# Patient Record
Sex: Female | Born: 1940 | Race: White | Hispanic: No | Marital: Married | Smoking: Former smoker
Health system: Southern US, Community
[De-identification: ages and names within clinical notes are randomized; demographics above are authoritative.]

## PROBLEM LIST (undated history)

## (undated) DIAGNOSIS — R479 Unspecified speech disturbances: Secondary | ICD-10-CM

## (undated) DIAGNOSIS — E7800 Pure hypercholesterolemia, unspecified: Secondary | ICD-10-CM

## (undated) DIAGNOSIS — E119 Type 2 diabetes mellitus without complications: Secondary | ICD-10-CM

## (undated) DIAGNOSIS — J449 Chronic obstructive pulmonary disease, unspecified: Secondary | ICD-10-CM

## (undated) DIAGNOSIS — R413 Other amnesia: Secondary | ICD-10-CM

## (undated) DIAGNOSIS — E079 Disorder of thyroid, unspecified: Secondary | ICD-10-CM

## (undated) DIAGNOSIS — I10 Essential (primary) hypertension: Secondary | ICD-10-CM

## (undated) HISTORY — DX: Essential (primary) hypertension: I10

## (undated) HISTORY — DX: Chronic obstructive pulmonary disease, unspecified: J44.9

## (undated) HISTORY — DX: Pure hypercholesterolemia, unspecified: E78.00

## (undated) HISTORY — PX: OTHER SURGICAL HISTORY: SHX169

## (undated) HISTORY — DX: Type 2 diabetes mellitus without complications: E11.9

## (undated) HISTORY — PX: RHINOPLASTY: SUR1284

## (undated) HISTORY — DX: Other amnesia: R41.3

## (undated) HISTORY — DX: Unspecified speech disturbances: R47.9

## (undated) HISTORY — PX: TONSILLECTOMY: SUR1361

---
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
## 2005-11-26 ENCOUNTER — Ambulatory Visit: Payer: Self-pay | Admitting: Orthopedic Surgery

## 2005-12-16 ENCOUNTER — Ambulatory Visit: Payer: Self-pay | Admitting: Orthopedic Surgery

## 2006-01-25 ENCOUNTER — Ambulatory Visit: Payer: Self-pay | Admitting: Orthopedic Surgery

## 2006-02-18 ENCOUNTER — Ambulatory Visit: Payer: Self-pay | Admitting: Orthopedic Surgery

## 2006-08-17 HISTORY — PX: CERVICAL FUSION: SHX112

## 2007-07-06 ENCOUNTER — Encounter: Payer: Self-pay | Admitting: Orthopedic Surgery

## 2007-08-24 ENCOUNTER — Encounter: Payer: Self-pay | Admitting: Orthopedic Surgery

## 2007-09-26 ENCOUNTER — Encounter: Payer: Self-pay | Admitting: Orthopedic Surgery

## 2007-11-16 ENCOUNTER — Encounter: Payer: Self-pay | Admitting: Orthopedic Surgery

## 2008-04-16 ENCOUNTER — Encounter: Payer: Self-pay | Admitting: Orthopedic Surgery

## 2008-08-02 ENCOUNTER — Encounter: Payer: Self-pay | Admitting: Orthopedic Surgery

## 2008-10-30 ENCOUNTER — Ambulatory Visit: Payer: Self-pay | Admitting: Cardiology

## 2008-11-15 ENCOUNTER — Encounter: Payer: Self-pay | Admitting: Orthopedic Surgery

## 2009-01-02 ENCOUNTER — Encounter: Admission: RE | Admit: 2009-01-02 | Discharge: 2009-01-02 | Payer: Self-pay | Admitting: Otolaryngology

## 2009-02-14 ENCOUNTER — Encounter: Payer: Self-pay | Admitting: Orthopedic Surgery

## 2009-07-03 ENCOUNTER — Encounter: Admission: RE | Admit: 2009-07-03 | Discharge: 2009-07-03 | Payer: Self-pay | Admitting: Otolaryngology

## 2009-11-14 ENCOUNTER — Encounter: Payer: Self-pay | Admitting: Orthopedic Surgery

## 2010-09-16 NOTE — Letter (Signed)
Summary: Office note [facility]ters  Office note [facility] Ch[facility]   Imported By: Cammie Sickle 12/02/2009 06:27:58  _____________________________________________________________________  External Attachment:    Type:   Image     Comment:   External Document

## 2011-06-08 HISTORY — PX: OTHER SURGICAL HISTORY: SHX169

## 2011-11-16 DIAGNOSIS — G459 Transient cerebral ischemic attack, unspecified: Secondary | ICD-10-CM

## 2012-01-04 ENCOUNTER — Other Ambulatory Visit: Payer: Self-pay | Admitting: Neurology

## 2012-01-04 DIAGNOSIS — G459 Transient cerebral ischemic attack, unspecified: Secondary | ICD-10-CM

## 2012-01-13 ENCOUNTER — Ambulatory Visit
Admission: RE | Admit: 2012-01-13 | Discharge: 2012-01-13 | Disposition: A | Discharge status: Home / Self Care | Payer: Medicare Other | Source: Ambulatory Visit | Attending: Neurology | Admitting: Neurology

## 2012-01-13 DIAGNOSIS — G459 Transient cerebral ischemic attack, unspecified: Secondary | ICD-10-CM

## 2012-01-13 MED ORDER — GADOBENATE DIMEGLUMINE 529 MG/ML IV SOLN
15.0000 mL | Freq: Once | INTRAVENOUS | Status: AC | PRN
  Administered 2012-01-13: 15 mL via INTRAVENOUS

## 2013-02-14 ENCOUNTER — Encounter: Payer: Self-pay | Admitting: Neurology

## 2013-02-14 DIAGNOSIS — I6320 Cerebral infarction due to unspecified occlusion or stenosis of unspecified precerebral arteries: Secondary | ICD-10-CM

## 2013-02-14 DIAGNOSIS — J3800 Paralysis of vocal cords and larynx, unspecified: Secondary | ICD-10-CM

## 2013-02-14 DIAGNOSIS — G459 Transient cerebral ischemic attack, unspecified: Secondary | ICD-10-CM

## 2013-02-14 DIAGNOSIS — M75101 Unspecified rotator cuff tear or rupture of right shoulder, not specified as traumatic: Secondary | ICD-10-CM

## 2013-02-14 DIAGNOSIS — R471 Dysarthria and anarthria: Secondary | ICD-10-CM | POA: Insufficient documentation

## 2013-02-14 DIAGNOSIS — G629 Polyneuropathy, unspecified: Secondary | ICD-10-CM

## 2013-02-14 DIAGNOSIS — R7303 Prediabetes: Secondary | ICD-10-CM

## 2013-02-14 DIAGNOSIS — G250 Essential tremor: Secondary | ICD-10-CM

## 2013-02-14 DIAGNOSIS — R413 Other amnesia: Secondary | ICD-10-CM

## 2013-02-27 ENCOUNTER — Encounter: Payer: Self-pay | Admitting: Neurology

## 2013-02-27 ENCOUNTER — Ambulatory Visit (INDEPENDENT_AMBULATORY_CARE_PROVIDER_SITE_OTHER): Payer: Medicare Other | Admitting: Neurology

## 2013-02-27 VITALS — BP 109/63 | HR 71 | Ht 63.0 in | Wt 158.0 lb

## 2013-02-27 DIAGNOSIS — I6789 Other cerebrovascular disease: Secondary | ICD-10-CM

## 2013-02-27 DIAGNOSIS — R0989 Other specified symptoms and signs involving the circulatory and respiratory systems: Secondary | ICD-10-CM

## 2013-02-27 DIAGNOSIS — R0683 Snoring: Secondary | ICD-10-CM

## 2013-02-27 DIAGNOSIS — G4730 Sleep apnea, unspecified: Secondary | ICD-10-CM

## 2013-02-27 DIAGNOSIS — R413 Other amnesia: Secondary | ICD-10-CM

## 2013-02-27 NOTE — Progress Notes (Signed)
[facility] [address] [place]. [place] [address] [phone]       OFFICE FOLLOW-UP NOTE  [name] Date of Birth:  1940-11-05 Medical Record Number:  [id number]   HPI:71 year with intermittent transient confusion, slurred speech and progressive memory loss possibly from small vessel disease on MRI. 02/27/2013  She is seen today for the first time after last visit with Dr. love on 08/24/12 and since his retirement she is here to establish neurology follow up with me. She has her history of intermittent confusion and memory loss as well as some slurred speech starting in 2009. Nose ears is seems to have slowly gotten worse. Initial MRI scan of the brain on 09/10/2008 showed changes of chronic microvascular ischemia involving the left pons as well as bilateral subcortical white matter. Vascular evaluation and that time including carotid Dopplers was unremarkable. Dr. love did evaluation for neuromuscular disease including CPK, acetylcholine receptor antibodies and EMG studies which were all normal. She has continued to have symptoms of mild cognitive impairment since then and has had 6 subjective worsening given the objective testing in the office had shown Mini-Mental scores ranging from 27-30. She has been on Aricept 10 mg daily for several years. Last MR scan of the brain had shown stable white matter changes on 5/29 /2013. 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 She says she continues to have some intermittent speech difficulties particularly when she is tired and her confusion, memory loss and speech difficulties all seem to be worse. On inquiry she admits to excessive daytime sleepiness and not enough sleep at night as well as snoring and husband admits to witnessed episodes of sleep apnea. She has never been evaluated with a sleep study. ROS:   14 system review of systems is positive for fatigue, trouble  swallowing, loss of vision, cough, snoring, incontinence, easy bleeding, joint pain, cramps, allergies and runny nose. Numbness, slurred speech, snoring, restless legs, gait and sleepiness, sleep apnea, decreased energy  PMH:  Past Medical History  Diagnosis Date  . COPD (chronic obstructive pulmonary disease)   . Hypertension   . Diabetes mellitus without complication   . Memory loss   . Speech disorder   . High cholesterol     Social History:  History   Social History  . Marital Status: Married    Spouse Name: N/A    Number of Children: N/A  . Years of Education: N/A   Occupational History  . Not on file.   Social History Main Topics  . Smoking status: Former Games developer  . Smokeless tobacco: Not on file  . Alcohol Use: Yes  . Drug Use: No  . Sexually Active: Not on file   Other Topics Concern  . Not on file   Social History Narrative   She lives at home with her husband, [name] Leriche.  She has a B. A.  She has 2 children.  She taught middle school and retired in 2004  She quit tobacco in June 2010.  She consumes 2-3 alcoholic beverages per week.  She consumes 2-3 beverages with caffeine per day.  She has 2 daughters.       Medications:   Current Outpatient Prescriptions on File Prior to Visit  Medication Sig Dispense Refill  . B Complex-C (SUPER B COMPLEX PO) Take 1 tablet by mouth daily.      . Calcium Carbonate (CALTRATE 600 PO) Take by mouth. Take twice a  week      . Cholecalciferol (VITAMIN D3) 1000 UNITS CAPS Take 100 Units by mouth daily.      . citalopram (CELEXA) 20 MG tablet Take 20 mg by mouth 2 (two) times daily.      Marland Kitchen dipyridamole-aspirin (AGGRENOX) 200-25 MG per 12 hr capsule Take 1 capsule by mouth 2 (two) times daily.      Marland Kitchen donepezil (ARICEPT) 10 MG tablet Take 10 mg by mouth daily.      . fesoterodine (TOVIAZ) 4 MG TB24 Take 4 mg by mouth daily.      Marland Kitchen levothyroxine (LEVOTHROID) 100 MCG tablet Take 100 mcg by mouth daily before breakfast.      .  olmesartan-hydrochlorothiazide (BENICAR HCT) 40-12.5 MG per tablet Take 1 tablet by mouth daily.      . Pediatric Multivitamins-Fl (CHEWABLE MULTIVITE/FL PO) Take by mouth.      . simvastatin (ZOCOR) 10 MG tablet Take 10 mg by mouth at bedtime.       No current facility-administered medications on file prior to visit.    Allergies:   Allergies  Allergen Reactions  . Crestor (Rosuvastatin)   . Lipitor (Atorvastatin)   . Penicillins   . Red Yeast Rice (Cholestin)    Filed Vitals:   02/27/13 1524  BP: 109/63  Pulse: 71     Physical Exam General: well developed, well nourished, seated, in no evident distress Head: head normocephalic and atraumatic. Orohparynx benign Neck: supple with no carotid or supraclavicular bruits Cardiovascular: regular rate and rhythm, no murmurs Musculoskeletal: no deformity Skin:  no rash/petichiae Vascular:  Normal pulses all extremities  Neurologic Exam Mental Status: Awake and fully alert. Oriented to place and time. Recent and remote memory intact. Attention span, concentration and fund of knowledge appropriate. Mood and affect appropriate. Mini-Mental status exam she scored 29/30 with only 1 deficit in recall. Animal fluency test she was able to name 17 animals.  Cranial Nerves: Fundoscopic exam reveals sharp disc margins. Pupils equal, briskly reactive to light. Extraocular movements full without nystagmus. Visual fields full to confrontation. Hearing intact. Facial sensation intact. Face, tongue, palate moves normally and symmetrically.  Motor: Normal bulk and tone. Normal strength in all tested extremity muscles. Sensory.: intact to tough and pinprick and vibratory.  Coordination: Rapid alternating movements normal in all extremities. Finger-to-nose and heel-to-shin performed accurately bilaterally. Gait and Station: Arises from chair without difficulty. Stance is normal. Gait demonstrates normal stride length and balance . Able to heel, toe and  tandem walk without difficulty.  Reflexes: 1+ and symmetric. Toes downgoing.     ASSESSMENT: 71 year with intermittent transient confusion, slurred speech and progressive memory loss possibly from small vessel disease on MRI. She has some recent subjective worsening of memory but objective cognitive testing in the office shows no significant change. 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    PLAN: Continue Aggrenox for stroke prevention at strict control of lipids with LDL cholesterol goal below 100 mg percent. Continue Aricept for cognitive impairment. Check repeat MRI scan of the brain for interval new changes as well as polysomnogram for sleep apnea. Return for followup in 6 months with Darrol Angel, NP

## 2013-02-27 NOTE — Patient Instructions (Signed)
Repeat MRI scan of the brain and polysomnogram for sleep apnea. Continue Aggrenox for stroke prevention and return for followup in 6 months with Darrol Angel, NP

## 2013-03-08 ENCOUNTER — Ambulatory Visit
Admission: RE | Admit: 2013-03-08 | Discharge: 2013-03-08 | Disposition: A | Discharge status: Home / Self Care | Payer: Medicare Other | Source: Ambulatory Visit | Attending: Neurology | Admitting: Neurology

## 2013-03-08 DIAGNOSIS — I6789 Other cerebrovascular disease: Secondary | ICD-10-CM

## 2013-03-08 DIAGNOSIS — R4701 Aphasia: Secondary | ICD-10-CM

## 2013-03-08 MED ORDER — GADOBENATE DIMEGLUMINE 529 MG/ML IV SOLN
8.0000 mL | Freq: Once | INTRAVENOUS | Status: AC | PRN
  Administered 2013-03-08: 8 mL via INTRAVENOUS

## 2013-08-05 ENCOUNTER — Emergency Department (HOSPITAL_COMMUNITY): Payer: Medicare Other

## 2013-08-05 ENCOUNTER — Encounter (HOSPITAL_COMMUNITY): Payer: Self-pay | Admitting: Emergency Medicine

## 2013-08-05 ENCOUNTER — Inpatient Hospital Stay (HOSPITAL_COMMUNITY)
Admission: EM | Admit: 2013-08-05 | Discharge: 2013-08-09 | DRG: 480 | Disposition: A | Discharge status: Skilled Nursing Facility | Payer: Medicare Other | Attending: Internal Medicine | Admitting: Internal Medicine

## 2013-08-05 DIAGNOSIS — G629 Polyneuropathy, unspecified: Secondary | ICD-10-CM

## 2013-08-05 DIAGNOSIS — M84453A Pathological fracture, unspecified femur, initial encounter for fracture: Principal | ICD-10-CM | POA: Diagnosis present

## 2013-08-05 DIAGNOSIS — E119 Type 2 diabetes mellitus without complications: Secondary | ICD-10-CM | POA: Diagnosis present

## 2013-08-05 DIAGNOSIS — R413 Other amnesia: Secondary | ICD-10-CM

## 2013-08-05 DIAGNOSIS — I10 Essential (primary) hypertension: Secondary | ICD-10-CM | POA: Diagnosis present

## 2013-08-05 DIAGNOSIS — Z87891 Personal history of nicotine dependence: Secondary | ICD-10-CM

## 2013-08-05 DIAGNOSIS — E785 Hyperlipidemia, unspecified: Secondary | ICD-10-CM | POA: Diagnosis present

## 2013-08-05 DIAGNOSIS — D62 Acute posthemorrhagic anemia: Secondary | ICD-10-CM | POA: Diagnosis not present

## 2013-08-05 DIAGNOSIS — R7303 Prediabetes: Secondary | ICD-10-CM | POA: Diagnosis present

## 2013-08-05 DIAGNOSIS — J189 Pneumonia, unspecified organism: Secondary | ICD-10-CM | POA: Diagnosis present

## 2013-08-05 DIAGNOSIS — R9389 Abnormal findings on diagnostic imaging of other specified body structures: Secondary | ICD-10-CM

## 2013-08-05 DIAGNOSIS — J449 Chronic obstructive pulmonary disease, unspecified: Secondary | ICD-10-CM | POA: Diagnosis present

## 2013-08-05 DIAGNOSIS — G459 Transient cerebral ischemic attack, unspecified: Secondary | ICD-10-CM | POA: Diagnosis present

## 2013-08-05 DIAGNOSIS — D649 Anemia, unspecified: Secondary | ICD-10-CM | POA: Diagnosis present

## 2013-08-05 DIAGNOSIS — Z981 Arthrodesis status: Secondary | ICD-10-CM

## 2013-08-05 DIAGNOSIS — E7800 Pure hypercholesterolemia, unspecified: Secondary | ICD-10-CM | POA: Diagnosis present

## 2013-08-05 DIAGNOSIS — N179 Acute kidney failure, unspecified: Secondary | ICD-10-CM | POA: Diagnosis present

## 2013-08-05 DIAGNOSIS — R471 Dysarthria and anarthria: Secondary | ICD-10-CM | POA: Diagnosis present

## 2013-08-05 DIAGNOSIS — W19XXXA Unspecified fall, initial encounter: Secondary | ICD-10-CM | POA: Diagnosis present

## 2013-08-05 DIAGNOSIS — D72829 Elevated white blood cell count, unspecified: Secondary | ICD-10-CM | POA: Diagnosis present

## 2013-08-05 DIAGNOSIS — S7291XA Unspecified fracture of right femur, initial encounter for closed fracture: Secondary | ICD-10-CM | POA: Insufficient documentation

## 2013-08-05 DIAGNOSIS — S72001A Fracture of unspecified part of neck of right femur, initial encounter for closed fracture: Secondary | ICD-10-CM

## 2013-08-05 DIAGNOSIS — E039 Hypothyroidism, unspecified: Secondary | ICD-10-CM | POA: Diagnosis present

## 2013-08-05 DIAGNOSIS — J4489 Other specified chronic obstructive pulmonary disease: Secondary | ICD-10-CM | POA: Diagnosis present

## 2013-08-05 DIAGNOSIS — C801 Malignant (primary) neoplasm, unspecified: Secondary | ICD-10-CM | POA: Diagnosis present

## 2013-08-05 DIAGNOSIS — C7951 Secondary malignant neoplasm of bone: Secondary | ICD-10-CM | POA: Diagnosis present

## 2013-08-05 HISTORY — DX: Disorder of thyroid, unspecified: E07.9

## 2013-08-05 LAB — CBC WITH DIFFERENTIAL/PLATELET
HCT: 26.4 % — ABNORMAL LOW (ref 36.0–46.0)
Hemoglobin: 8.6 g/dL — ABNORMAL LOW (ref 12.0–15.0)
Lymphs Abs: 1 10*3/uL (ref 0.7–4.0)
MCH: 29 pg (ref 26.0–34.0)
Monocytes Absolute: 1.6 10*3/uL — ABNORMAL HIGH (ref 0.1–1.0)
Monocytes Relative: 8 % (ref 3–12)
Neutro Abs: 18.6 10*3/uL — ABNORMAL HIGH (ref 1.7–7.7)
Neutrophils Relative %: 87 % — ABNORMAL HIGH (ref 43–77)
RBC: 2.97 MIL/uL — ABNORMAL LOW (ref 3.87–5.11)

## 2013-08-05 LAB — POCT I-STAT, CHEM 8
Creatinine, Ser: 1.8 mg/dL — ABNORMAL HIGH (ref 0.50–1.10)
Glucose, Bld: 120 mg/dL — ABNORMAL HIGH (ref 70–99)
Hemoglobin: 20.4 g/dL — ABNORMAL HIGH (ref 12.0–15.0)
Potassium: 4.3 mEq/L (ref 3.5–5.1)
TCO2: 24 mmol/L (ref 0–100)

## 2013-08-05 LAB — URINALYSIS, ROUTINE W REFLEX MICROSCOPIC
Bilirubin Urine: NEGATIVE
Glucose, UA: NEGATIVE mg/dL
Hgb urine dipstick: NEGATIVE
Ketones, ur: NEGATIVE mg/dL
Leukocytes, UA: NEGATIVE
Protein, ur: NEGATIVE mg/dL
Urobilinogen, UA: 0.2 mg/dL (ref 0.0–1.0)
pH: 5 (ref 5.0–8.0)

## 2013-08-05 MED ORDER — MORPHINE SULFATE 2 MG/ML IJ SOLN: 0.5000 mg | INTRAMUSCULAR | Status: DC | PRN

## 2013-08-05 MED ORDER — FENTANYL CITRATE 0.05 MG/ML IJ SOLN
100.0000 ug | Freq: Once | INTRAMUSCULAR | Status: AC
  Administered 2013-08-05: 100 ug via INTRAVENOUS
  Filled 2013-08-05: qty 2

## 2013-08-05 MED ORDER — HYDROCODONE-ACETAMINOPHEN 5-325 MG PO TABS: 1.0000 | ORAL_TABLET | ORAL | Status: AC | PRN

## 2013-08-05 MED ORDER — SODIUM CHLORIDE 0.9 % IV SOLN
INTRAVENOUS | Status: AC
  Administered 2013-08-06: 01:00:00 via INTRAVENOUS

## 2013-08-05 MED ORDER — FENTANYL CITRATE 0.05 MG/ML IJ SOLN: 100.0000 ug | Freq: Once | INTRAMUSCULAR | Status: DC

## 2013-08-05 MED ORDER — OXYCODONE-ACETAMINOPHEN 5-325 MG PO TABS: 1.0000 | ORAL_TABLET | ORAL | Status: AC | PRN

## 2013-08-05 MED ORDER — MORPHINE SULFATE 4 MG/ML IJ SOLN
4.0000 mg | Freq: Once | INTRAMUSCULAR | Status: AC
  Administered 2013-08-05: 4 mg via INTRAVENOUS
  Filled 2013-08-05: qty 1

## 2013-08-05 MED ORDER — HYDROMORPHONE HCL PF 1 MG/ML IJ SOLN: 0.1000 mg | INTRAMUSCULAR | Status: DC | PRN

## 2013-08-05 NOTE — ED Notes (Signed)
Pt transported to Xray. 

## 2013-08-05 NOTE — ED Provider Notes (Signed)
CSN: [id number]     Arrival date & time 08/05/13  1908 History   First MD Initiated Contact with Patient 08/05/13 1936     Chief Complaint  Patient presents with  . Fall  . Hip Pain    (Consider location/radiation/quality/duration/timing/severity/associated sxs/prior Treatment) HPI Complains of right-sided hip pain after she fell in a department store approximately 6:30 PM today. No other injury. Treated by EMS with morphine 10 mg IV prior to arrival. Pain is a right hip. Nonradiating. Worse with movement, sharp and severe Past Medical History  Diagnosis Date  . COPD (chronic obstructive pulmonary disease)   . Hypertension   . Diabetes mellitus without complication   . Memory loss   . Speech disorder   . High cholesterol   . Thyroid disease     hypothyroid   Past Surgical History  Procedure Laterality Date  . Tonsillectomy    . Breast cyst excision Right 1960  . Rhinoplasty  [id number]  . Cervical fusion  2008  . Right ankle surgery    . Cataract surgery Left 06/08/2011   Family History  Problem Relation Age of Onset  . Parkinsonism Father    History  Substance Use Topics  . Smoking status: Former Games developer  . Smokeless tobacco: Not on file  . Alcohol Use: No   OB History   Grav Para Term Preterm Abortions TAB SAB Ect Mult Living                 Review of Systems  Constitutional: Negative.   HENT: Negative.   Respiratory: Negative.   Cardiovascular: Negative.   Gastrointestinal: Negative.   Musculoskeletal: Positive for arthralgias.       Right hip pain  Skin: Negative.   Neurological: Negative.   Psychiatric/Behavioral: Negative.   All other systems reviewed and are negative.    Allergies  Crestor; Lipitor; Penicillins; and Red yeast rice  Home Medications   Current Outpatient Rx  Name  Route  Sig  Dispense  Refill  . B Complex-C (SUPER B COMPLEX PO)   Oral   Take 1 tablet by mouth daily.         . Calcium Carbonate (CALTRATE 600 PO)   Oral  Take by mouth. Take twice a week         . Cholecalciferol (VITAMIN D3) 1000 UNITS CAPS   Oral   Take 100 Units by mouth daily.         . citalopram (CELEXA) 20 MG tablet   Oral   Take 20 mg by mouth 2 (two) times daily.         Marland Kitchen dipyridamole-aspirin (AGGRENOX) 200-25 MG per 12 hr capsule   Oral   Take 1 capsule by mouth 2 (two) times daily.         Marland Kitchen donepezil (ARICEPT) 10 MG tablet   Oral   Take 10 mg by mouth daily.         . fesoterodine (TOVIAZ) 4 MG TB24   Oral   Take 4 mg by mouth daily.         Marland Kitchen levothyroxine (LEVOTHROID) 100 MCG tablet   Oral   Take 100 mcg by mouth daily before breakfast.         . MAGNESIUM CITRATE PO   Oral   Take 400 mg by mouth daily.         Marland Kitchen olmesartan-hydrochlorothiazide (BENICAR HCT) 40-12.5 MG per tablet   Oral   Take 1 tablet by mouth daily.         Marland Kitchen  Pediatric Multivitamins-Fl (CHEWABLE MULTIVITE/FL PO)   Oral   Take by mouth.         . simvastatin (ZOCOR) 10 MG tablet   Oral   Take 10 mg by mouth at bedtime.         . VENTOLIN HFA 108 (90 BASE) MCG/ACT inhaler                BP 119/58  Pulse 85  Temp(Src) 98.1 F (36.7 C) (Oral)  Resp 22  Ht 5\' 3"  (1.6 m)  Wt 140 lb (63.504 kg)  BMI 24.81 kg/m2  SpO2 100% Physical Exam  Nursing note and vitals reviewed. Constitutional: She is oriented to person, place, and time. She appears well-developed and well-nourished.  HENT:  Head: Normocephalic and atraumatic.  Eyes: Conjunctivae are normal. Pupils are equal, round, and reactive to light.  Neck: Neck supple. No tracheal deviation present. No thyromegaly present.  Cardiovascular: Normal rate and regular rhythm.   No murmur heard. Pulmonary/Chest: Effort normal and breath sounds normal.  Abdominal: Soft. Bowel sounds are normal. She exhibits no distension. There is no tenderness.  Musculoskeletal: Normal range of motion. She exhibits tenderness. She exhibits no edema.  Right lower extremity  shortened externally rotated tender at hip. DP pulse 2+ all other extremities without contusion abrasion or tenderness neurovascularly intact. Entire spine nontender   Neurological: She is alert and oriented to person, place, and time. Coordination normal.  Skin: Skin is warm and dry. No rash noted.  Psychiatric: She has a normal mood and affect.    ED Course  Procedures (including critical care time) Labs Review Labs Reviewed  CBC WITH DIFFERENTIAL  CBC  URINALYSIS, ROUTINE W REFLEX MICROSCOPIC   Imaging Review No results found.  Date: 08/05/2013  Rate: 85  Rhythm: normal sinus rhythm  QRS Axis: left  Intervals: normal  ST/T Wave abnormalities: nonspecific T wave changes  Conduction Disutrbances:none  Narrative Interpretation:   Old EKG Reviewed: none available  EKG Interpretation   None      Results for orders placed during the hospital encounter of 08/05/13  CBC WITH DIFFERENTIAL      Result Value Range   WBC 21.3 (*) 4.0 - 10.5 K/uL   RBC 2.97 (*) 3.87 - 5.11 MIL/uL   Hemoglobin 8.6 (*) 12.0 - 15.0 g/dL   HCT 16.1 (*) 09.6 - 04.5 %   MCV 88.9  78.0 - 100.0 fL   MCH 29.0  26.0 - 34.0 pg   MCHC 32.6  30.0 - 36.0 g/dL   RDW 40.9  81.1 - 91.4 %   Platelets 484 (*) 150 - 400 K/uL   Neutrophils Relative % 87 (*) 43 - 77 %   Neutro Abs 18.6 (*) 1.7 - 7.7 K/uL   Lymphocytes Relative 5 (*) 12 - 46 %   Lymphs Abs 1.0  0.7 - 4.0 K/uL   Monocytes Relative 8  3 - 12 %   Monocytes Absolute 1.6 (*) 0.1 - 1.0 K/uL   Eosinophils Relative 0  0 - 5 %   Eosinophils Absolute 0.1  0.0 - 0.7 K/uL   Basophils Relative 0  0 - 1 %   Basophils Absolute 0.0  0.0 - 0.1 K/uL  URINALYSIS, ROUTINE W REFLEX MICROSCOPIC      Result Value Range   Color, Urine YELLOW  YELLOW   APPearance CLOUDY (*) CLEAR   Specific Gravity, Urine 1.017  1.005 - 1.030   pH 5.0  5.0 - 8.0  Glucose, UA NEGATIVE  NEGATIVE mg/dL   Hgb urine dipstick NEGATIVE  NEGATIVE   Bilirubin Urine NEGATIVE  NEGATIVE    Ketones, ur NEGATIVE  NEGATIVE mg/dL   Protein, ur NEGATIVE  NEGATIVE mg/dL   Urobilinogen, UA 0.2  0.0 - 1.0 mg/dL   Nitrite NEGATIVE  NEGATIVE   Leukocytes, UA NEGATIVE  NEGATIVE  POCT I-STAT, CHEM 8      Result Value Range   Sodium 138  135 - 145 mEq/L   Potassium 4.3  3.5 - 5.1 mEq/L   Chloride 104  96 - 112 mEq/L   BUN 46 (*) 6 - 23 mg/dL   Creatinine, Ser 2.13 (*) 0.50 - 1.10 mg/dL   Glucose, Bld 086 (*) 70 - 99 mg/dL   Calcium, Ion 5.78  4.69 - 1.30 mmol/L   TCO2 24  0 - 100 mmol/L   Hemoglobin 20.4 (*) 12.0 - 15.0 g/dL   HCT 62.9 (*) 52.8 - 41.3 %   Dg Chest 1 View  08/05/2013   CLINICAL DATA:  Fall, hip pain  EXAM: CHEST - 1 VIEW  COMPARISON:  11/14/2011  FINDINGS: Right upper lobe opacity. While possibly infectious/inflammatory, in the absence of infectious symptoms, underlying mass should be considered.  No pleural effusion or pneumothorax.  Cardiomegaly.  Cervical spine fixation hardware.  IMPRESSION: Right upper lobe opacity. In the absence of infectious symptoms, consider CT chest with contrast to exclude underlying mass.   Electronically Signed   By: Charline Bills M.D.   On: 08/05/2013 21:18   Dg Hip Complete Right  08/05/2013   CLINICAL DATA:  Trauma/fall, right hip deformity  EXAM: RIGHT HIP - COMPLETE 2+ VIEW  COMPARISON:  None.  FINDINGS: Subtrochanteric right proximal femur fracture. Foreshortening with varus angulation.  Associated lucency/irregularity along the fracture site raises the possibility of an underlying pathologic lesion. Mild patchy sclerosis with associated cortical thickening is present in this region on recent radiographs, although no definite underlying lesion is visualized on the study.  Degenerative changes of the bilateral hips.  Visualized bony pelvis appears intact.  IMPRESSION: Subtrochanteric right proximal femur fracture.  Although not definitely visualized on the prior study, an underlying pathologic lesion is not excluded.   Electronically  Signed   By: Charline Bills M.D.   On: 08/05/2013 21:14    X-ray viewed by me 11:10 PM pain under control after treatment with intravenous opioids Spoke with Dr.Blackman will evaluate patient in ED and will operate tomorrow requests admission to hospitalist service MDM  No diagnosis found. Spoke with Dr. Lovell Sheehan who will accept patient for admission Diagnosis #1 closed fracture of right hip #2 anemia  #3 renal insufficiency  Doug Sou, MD 08/05/13 2316

## 2013-08-05 NOTE — Consult Note (Signed)
Reason for Consult:  Right subtrochanteric femur/hip fracture Referring Physician:   Alice Reichert, ED MD  [name] [name] is an 72 y.o. female.  HPI:   72 yo female who sustained a mechanical fall earlier this evening while out shopping.  She says she felt a pop and then her hip gave way and she fell.  She had severe right hip pain following this and was brought to the [facility] ED.  A right subtroch femur/hip fracture was found and Ortho is consulted.  Past Medical History  Diagnosis Date  . COPD (chronic obstructive pulmonary disease)   . Hypertension   . Diabetes mellitus without complication   . Memory loss   . Speech disorder   . High cholesterol   . Thyroid disease     hypothyroid    Past Surgical History  Procedure Laterality Date  . Tonsillectomy    . Breast cyst excision Right 1960  . Rhinoplasty  [id number]  . Cervical fusion  2008  . Right ankle surgery    . Cataract surgery Left 06/08/2011    Family History  Problem Relation Age of Onset  . Parkinsonism Father     Social History:  reports that she has quit smoking. She does not have any smokeless tobacco history on file. She reports that she does not drink alcohol or use illicit drugs.  Allergies:  Allergies  Allergen Reactions  . Crestor [Rosuvastatin]   . Lipitor [Atorvastatin]   . Penicillins   . Red Yeast Rice [Cholestin]     Medications: I have reviewed the patient's current medications.  Results for orders placed during the hospital encounter of 08/05/13 (from the past 48 hour(s))  URINALYSIS, ROUTINE W REFLEX MICROSCOPIC     Status: Abnormal   Collection Time    08/05/13  9:00 PM      Result Value Range   Color, Urine YELLOW  YELLOW   APPearance CLOUDY (*) CLEAR   Specific Gravity, Urine 1.017  1.005 - 1.030   pH 5.0  5.0 - 8.0   Glucose, UA NEGATIVE  NEGATIVE mg/dL   Hgb urine dipstick NEGATIVE  NEGATIVE   Bilirubin Urine NEGATIVE  NEGATIVE   Ketones, ur NEGATIVE  NEGATIVE mg/dL   Protein, ur NEGATIVE  NEGATIVE mg/dL   Urobilinogen, UA 0.2  0.0 - 1.0 mg/dL   Nitrite NEGATIVE  NEGATIVE   Leukocytes, UA NEGATIVE  NEGATIVE   Comment: MICROSCOPIC NOT DONE ON URINES WITH NEGATIVE PROTEIN, BLOOD, LEUKOCYTES, NITRITE, OR GLUCOSE <1000 mg/dL.  POCT I-STAT, CHEM 8     Status: Abnormal   Collection Time    08/05/13  9:38 PM      Result Value Range   Sodium 138  135 - 145 mEq/L   Potassium 4.3  3.5 - 5.1 mEq/L   Chloride 104  96 - 112 mEq/L   BUN 46 (*) 6 - 23 mg/dL   Creatinine, Ser 9.60 (*) 0.50 - 1.10 mg/dL   Glucose, Bld 454 (*) 70 - 99 mg/dL   Calcium, Ion 0.98  1.19 - 1.30 mmol/L   TCO2 24  0 - 100 mmol/L   Hemoglobin 20.4 (*) 12.0 - 15.0 g/dL   HCT 14.7 (*) 82.9 - 56.2 %    Dg Chest 1 View  08/05/2013   CLINICAL DATA:  Fall, hip pain  EXAM: CHEST - 1 VIEW  COMPARISON:  11/14/2011  FINDINGS: Right upper lobe opacity. While possibly infectious/inflammatory, in the absence of infectious symptoms, underlying mass should be considered.  No pleural effusion or pneumothorax.  Cardiomegaly.  Cervical spine fixation hardware.  IMPRESSION: Right upper lobe opacity. In the absence of infectious symptoms, consider CT chest with contrast to exclude underlying mass.   Electronically Signed   By: Charline Bills M.D.   On: 08/05/2013 21:18   Dg Hip Complete Right  08/05/2013   CLINICAL DATA:  Trauma/fall, right hip deformity  EXAM: RIGHT HIP - COMPLETE 2+ VIEW  COMPARISON:  None.  FINDINGS: Subtrochanteric right proximal femur fracture. Foreshortening with varus angulation.  Associated lucency/irregularity along the fracture site raises the possibility of an underlying pathologic lesion. Mild patchy sclerosis with associated cortical thickening is present in this region on recent radiographs, although no definite underlying lesion is visualized on the study.  Degenerative changes of the bilateral hips.  Visualized bony pelvis appears intact.  IMPRESSION: Subtrochanteric right  proximal femur fracture.  Although not definitely visualized on the prior study, an underlying pathologic lesion is not excluded.   Electronically Signed   By: Charline Bills M.D.   On: 08/05/2013 21:14    ROS Blood pressure 112/52, pulse 83, temperature 98.1 F (36.7 C), temperature source Oral, resp. rate 23, height 5\' 3"  (1.6 m), weight 63.504 kg (140 lb), SpO2 94.00%. Physical Exam  Musculoskeletal:       Right hip: She exhibits decreased range of motion, bony tenderness and deformity.  Her right leg is significantly shortened.  She moves her right foot well and her foot is well-perfused. She has decreased sensation in both feet, but this is chronic she says from neuropathy.  Assessment/Plan: Right subtrochanteric femur fracture 1)  I spoke with her and her family in length about her injury.  This does need surgical fixation with an intramedullary rod and screws construct.  I also talked with the family that there is a chance that this could be pathologic since she did state that she felt a pop then fell.  Also, the radiologist did comment about this as well.  At the time of surgery, I will send off bone in that area to pathology.  Buck's traction for tonight and NPO after midnight pending clearance by the medical team.  Kathryne Hitch 08/05/2013, 10:35 PM

## 2013-08-05 NOTE — ED Notes (Addendum)
Per EMS, pt was walking and heard a loud pop. Pt has right hip deformity with external rotation and shortening of the affected extremity. EMS VS BP:130/62, HR:85, RR:25, SpO2: 96%. 20g gauge in left a/c. Pt received 10mg  of morphine total in route. No LOC.

## 2013-08-06 ENCOUNTER — Encounter (HOSPITAL_COMMUNITY): Payer: Medicare Other | Admitting: Critical Care Medicine

## 2013-08-06 ENCOUNTER — Inpatient Hospital Stay (HOSPITAL_COMMUNITY): Payer: Medicare Other

## 2013-08-06 ENCOUNTER — Inpatient Hospital Stay (HOSPITAL_COMMUNITY): Payer: Medicare Other | Admitting: Critical Care Medicine

## 2013-08-06 ENCOUNTER — Encounter (HOSPITAL_COMMUNITY)
Admission: EM | Disposition: A | Discharge status: Skilled Nursing Facility | Payer: Self-pay | Source: Home / Self Care | Attending: Internal Medicine

## 2013-08-06 ENCOUNTER — Encounter (HOSPITAL_COMMUNITY): Payer: Self-pay | Admitting: *Deleted

## 2013-08-06 DIAGNOSIS — I10 Essential (primary) hypertension: Secondary | ICD-10-CM | POA: Diagnosis present

## 2013-08-06 DIAGNOSIS — S72009A Fracture of unspecified part of neck of unspecified femur, initial encounter for closed fracture: Secondary | ICD-10-CM

## 2013-08-06 DIAGNOSIS — D649 Anemia, unspecified: Secondary | ICD-10-CM

## 2013-08-06 DIAGNOSIS — E785 Hyperlipidemia, unspecified: Secondary | ICD-10-CM

## 2013-08-06 DIAGNOSIS — R7309 Other abnormal glucose: Secondary | ICD-10-CM

## 2013-08-06 HISTORY — PX: FEMUR IM NAIL: SHX1597

## 2013-08-06 LAB — GLUCOSE, CAPILLARY
Glucose-Capillary: 102 mg/dL — ABNORMAL HIGH (ref 70–99)
Glucose-Capillary: 87 mg/dL (ref 70–99)
Glucose-Capillary: 105 mg/dL — ABNORMAL HIGH (ref 70–99)
Glucose-Capillary: 103 mg/dL — ABNORMAL HIGH (ref 70–99)

## 2013-08-06 LAB — BASIC METABOLIC PANEL
BUN: 43 mg/dL — ABNORMAL HIGH (ref 6–23)
BUN: 48 mg/dL — ABNORMAL HIGH (ref 6–23)
CO2: 21 mEq/L (ref 19–32)
Calcium: 9.4 mg/dL (ref 8.4–10.5)
Calcium: 9.4 mg/dL (ref 8.4–10.5)
Creatinine, Ser: 1.45 mg/dL — ABNORMAL HIGH (ref 0.50–1.10)
Creatinine, Ser: 1.6 mg/dL — ABNORMAL HIGH (ref 0.50–1.10)
GFR calc Af Amer: 36 mL/min — ABNORMAL LOW (ref >90)
GFR calc non Af Amer: 31 mL/min — ABNORMAL LOW (ref >90)
Glucose, Bld: 95 mg/dL (ref 70–99)

## 2013-08-06 LAB — CBC
MCH: 29.3 pg (ref 26.0–34.0)
MCHC: 32.8 g/dL (ref 30.0–36.0)
MCV: 89.1 fL (ref 78.0–100.0)
Platelets: 501 10*3/uL — ABNORMAL HIGH (ref 150–400)
RDW: 12.9 % (ref 11.5–15.5)

## 2013-08-06 LAB — TSH: TSH: 0.669 u[IU]/mL (ref 0.350–4.500)

## 2013-08-06 LAB — SURGICAL PCR SCREEN: Staphylococcus aureus: NEGATIVE

## 2013-08-06 SURGERY — INSERTION, INTRAMEDULLARY ROD, FEMUR
Anesthesia: General | Site: Hip | Laterality: Right

## 2013-08-06 MED ORDER — ONDANSETRON HCL 4 MG PO TABS: 4.0000 mg | ORAL_TABLET | Freq: Four times a day (QID) | ORAL | Status: DC | PRN

## 2013-08-06 MED ORDER — ASPIRIN-DIPYRIDAMOLE ER 25-200 MG PO CP12
1.0000 | ORAL_CAPSULE | Freq: Two times a day (BID) | ORAL | Status: DC
  Administered 2013-08-06 – 2013-08-09 (×6): 1 via ORAL
  Filled 2013-08-06 (×7): qty 1

## 2013-08-06 MED ORDER — NEOSTIGMINE METHYLSULFATE 1 MG/ML IJ SOLN
INTRAMUSCULAR | Status: DC | PRN
  Administered 2013-08-06: 4 mg via INTRAVENOUS

## 2013-08-06 MED ORDER — METHOCARBAMOL 500 MG PO TABS
500.0000 mg | ORAL_TABLET | Freq: Four times a day (QID) | ORAL | Status: DC | PRN
  Administered 2013-08-06 – 2013-08-09 (×4): 500 mg via ORAL
  Filled 2013-08-06 (×4): qty 1

## 2013-08-06 MED ORDER — METOCLOPRAMIDE HCL 5 MG PO TABS
5.0000 mg | ORAL_TABLET | Freq: Three times a day (TID) | ORAL | Status: DC | PRN
  Filled 2013-08-06: qty 2

## 2013-08-06 MED ORDER — ZOLPIDEM TARTRATE 5 MG PO TABS: 5.0000 mg | ORAL_TABLET | Freq: Every evening | ORAL | Status: DC | PRN

## 2013-08-06 MED ORDER — HYDRALAZINE HCL 20 MG/ML IJ SOLN: 10.0000 mg | Freq: Four times a day (QID) | INTRAMUSCULAR | Status: DC | PRN

## 2013-08-06 MED ORDER — METOCLOPRAMIDE HCL 5 MG/ML IJ SOLN: 5.0000 mg | Freq: Three times a day (TID) | INTRAMUSCULAR | Status: DC | PRN

## 2013-08-06 MED ORDER — FENTANYL CITRATE 0.05 MG/ML IJ SOLN
INTRAMUSCULAR | Status: DC | PRN
  Administered 2013-08-06: 25 ug via INTRAVENOUS
  Administered 2013-08-06: 100 ug via INTRAVENOUS
  Administered 2013-08-06: 25 ug via INTRAVENOUS

## 2013-08-06 MED ORDER — DOCUSATE SODIUM 100 MG PO CAPS
100.0000 mg | ORAL_CAPSULE | Freq: Two times a day (BID) | ORAL | Status: DC
  Administered 2013-08-06 – 2013-08-09 (×6): 100 mg via ORAL
  Filled 2013-08-06 (×7): qty 1

## 2013-08-06 MED ORDER — HYDROMORPHONE HCL PF 1 MG/ML IJ SOLN
0.5000 mg | INTRAMUSCULAR | Status: DC | PRN
  Administered 2013-08-06: 1 mg via INTRAVENOUS
  Filled 2013-08-06: qty 1

## 2013-08-06 MED ORDER — INSULIN ASPART 100 UNIT/ML ~~LOC~~ SOLN: 0.0000 [IU] | SUBCUTANEOUS | Status: DC

## 2013-08-06 MED ORDER — ALBUTEROL SULFATE (5 MG/ML) 0.5% IN NEBU: 2.5000 mg | INHALATION_SOLUTION | Freq: Four times a day (QID) | RESPIRATORY_TRACT | Status: DC | PRN

## 2013-08-06 MED ORDER — ONDANSETRON HCL 4 MG/2ML IJ SOLN: 4.0000 mg | Freq: Four times a day (QID) | INTRAMUSCULAR | Status: DC | PRN

## 2013-08-06 MED ORDER — MENTHOL 3 MG MT LOZG: 1.0000 | LOZENGE | OROMUCOSAL | Status: DC | PRN

## 2013-08-06 MED ORDER — PHENYLEPHRINE HCL 10 MG/ML IJ SOLN
10.0000 mg | INTRAVENOUS | Status: DC | PRN
  Administered 2013-08-06: 25 ug/min via INTRAVENOUS

## 2013-08-06 MED ORDER — ROCURONIUM BROMIDE 100 MG/10ML IV SOLN
INTRAVENOUS | Status: DC | PRN
  Administered 2013-08-06: 10 mg via INTRAVENOUS
  Administered 2013-08-06: 30 mg via INTRAVENOUS

## 2013-08-06 MED ORDER — 0.9 % SODIUM CHLORIDE (POUR BTL) OPTIME
TOPICAL | Status: DC | PRN
  Administered 2013-08-06: 1000 mL

## 2013-08-06 MED ORDER — DEXTROSE 5 % IV SOLN
500.0000 mg | Freq: Four times a day (QID) | INTRAVENOUS | Status: DC | PRN
  Filled 2013-08-06: qty 5

## 2013-08-06 MED ORDER — LACTATED RINGERS IV SOLN
INTRAVENOUS | Status: DC | PRN
  Administered 2013-08-06: 10:00:00 via INTRAVENOUS

## 2013-08-06 MED ORDER — POLYETHYLENE GLYCOL 3350 17 G PO PACK: 17.0000 g | PACK | Freq: Every day | ORAL | Status: DC | PRN

## 2013-08-06 MED ORDER — CITALOPRAM HYDROBROMIDE 20 MG PO TABS
20.0000 mg | ORAL_TABLET | Freq: Every day | ORAL | Status: DC
  Administered 2013-08-06 – 2013-08-09 (×4): 20 mg via ORAL
  Filled 2013-08-06 (×4): qty 1

## 2013-08-06 MED ORDER — FENTANYL CITRATE 0.05 MG/ML IJ SOLN: 25.0000 ug | INTRAMUSCULAR | Status: DC | PRN

## 2013-08-06 MED ORDER — HYDROCODONE-ACETAMINOPHEN 5-325 MG PO TABS
1.0000 | ORAL_TABLET | Freq: Four times a day (QID) | ORAL | Status: DC | PRN
  Administered 2013-08-07 (×2): 2 via ORAL
  Administered 2013-08-08: 1 via ORAL
  Administered 2013-08-09: 2 via ORAL
  Filled 2013-08-06 (×2): qty 2
  Filled 2013-08-06: qty 1
  Filled 2013-08-06: qty 2

## 2013-08-06 MED ORDER — OXYCODONE HCL 5 MG PO TABS
5.0000 mg | ORAL_TABLET | ORAL | Status: DC | PRN
  Administered 2013-08-06 – 2013-08-09 (×5): 5 mg via ORAL
  Filled 2013-08-06 (×5): qty 1

## 2013-08-06 MED ORDER — DONEPEZIL HCL 10 MG PO TABS
10.0000 mg | ORAL_TABLET | Freq: Every day | ORAL | Status: DC
  Administered 2013-08-06 – 2013-08-09 (×4): 10 mg via ORAL
  Filled 2013-08-06 (×4): qty 1

## 2013-08-06 MED ORDER — DROPERIDOL 2.5 MG/ML IJ SOLN: 0.6250 mg | INTRAMUSCULAR | Status: DC | PRN

## 2013-08-06 MED ORDER — PHENYLEPHRINE HCL 10 MG/ML IJ SOLN
INTRAMUSCULAR | Status: DC | PRN
  Administered 2013-08-06 (×2): 80 ug via INTRAVENOUS
  Administered 2013-08-06: 120 ug via INTRAVENOUS
  Administered 2013-08-06: 80 ug via INTRAVENOUS

## 2013-08-06 MED ORDER — SIMVASTATIN 10 MG PO TABS
10.0000 mg | ORAL_TABLET | Freq: Every day | ORAL | Status: DC
  Administered 2013-08-06 – 2013-08-08 (×3): 10 mg via ORAL
  Filled 2013-08-06 (×4): qty 1

## 2013-08-06 MED ORDER — ONDANSETRON HCL 4 MG/2ML IJ SOLN
INTRAMUSCULAR | Status: DC | PRN
  Administered 2013-08-06: 4 mg via INTRAVENOUS

## 2013-08-06 MED ORDER — CLINDAMYCIN PHOSPHATE 900 MG/50ML IV SOLN
900.0000 mg | Freq: Once | INTRAVENOUS | Status: AC
  Administered 2013-08-06: 900 mg via INTRAVENOUS
  Filled 2013-08-06: qty 50

## 2013-08-06 MED ORDER — CLINDAMYCIN PHOSPHATE 600 MG/50ML IV SOLN
600.0000 mg | Freq: Four times a day (QID) | INTRAVENOUS | Status: AC
  Administered 2013-08-06 (×2): 600 mg via INTRAVENOUS
  Filled 2013-08-06 (×2): qty 50

## 2013-08-06 MED ORDER — GLYCOPYRROLATE 0.2 MG/ML IJ SOLN
INTRAMUSCULAR | Status: DC | PRN
  Administered 2013-08-06: 0.6 mg via INTRAVENOUS

## 2013-08-06 MED ORDER — PHENOL 1.4 % MT LIQD: 1.0000 | OROMUCOSAL | Status: DC | PRN

## 2013-08-06 MED ORDER — SODIUM CHLORIDE 0.9 % IV SOLN
INTRAVENOUS | Status: DC | PRN
  Administered 2013-08-06: 12:00:00 via INTRAVENOUS

## 2013-08-06 MED ORDER — LEVOTHYROXINE SODIUM 100 MCG PO TABS
100.0000 ug | ORAL_TABLET | Freq: Every day | ORAL | Status: DC
  Administered 2013-08-07 – 2013-08-09 (×3): 100 ug via ORAL
  Filled 2013-08-06 (×5): qty 1

## 2013-08-06 MED ORDER — SODIUM CHLORIDE 0.9 % IV SOLN
INTRAVENOUS | Status: DC
  Administered 2013-08-06 – 2013-08-07 (×2): 75 mL/h via INTRAVENOUS

## 2013-08-06 MED ORDER — ALUM & MAG HYDROXIDE-SIMETH 200-200-20 MG/5ML PO SUSP: 30.0000 mL | Freq: Four times a day (QID) | ORAL | Status: DC | PRN

## 2013-08-06 MED ORDER — ACETAMINOPHEN 325 MG PO TABS: 650.0000 mg | ORAL_TABLET | Freq: Four times a day (QID) | ORAL | Status: DC | PRN

## 2013-08-06 MED ORDER — LIDOCAINE HCL (CARDIAC) 20 MG/ML IV SOLN
INTRAVENOUS | Status: DC | PRN
  Administered 2013-08-06: 20 mg via INTRAVENOUS

## 2013-08-06 MED ORDER — ACETAMINOPHEN 650 MG RE SUPP: 650.0000 mg | Freq: Four times a day (QID) | RECTAL | Status: DC | PRN

## 2013-08-06 MED ORDER — PROPOFOL 10 MG/ML IV BOLUS
INTRAVENOUS | Status: DC | PRN
  Administered 2013-08-06 (×2): 50 mg via INTRAVENOUS

## 2013-08-06 MED ORDER — MORPHINE SULFATE 2 MG/ML IJ SOLN: 0.5000 mg | INTRAMUSCULAR | Status: DC | PRN

## 2013-08-06 SURGICAL SUPPLY — 46 items
BIT DRILL LONG 4.0MM (BIT) IMPLANT
BIT DRILL SHORT 4.0 (BIT) IMPLANT
BNDG COHESIVE 4X5 TAN NS LF (GAUZE/BANDAGES/DRESSINGS) ×2 IMPLANT
CLOTH BEACON ORANGE TIMEOUT ST (SAFETY) ×2 IMPLANT
COVER SURGICAL LIGHT HANDLE (MISCELLANEOUS) ×2 IMPLANT
DRAPE STERI IOBAN 125X83 (DRAPES) ×4 IMPLANT
DRILL BIT LONG 4.0MM (BIT) ×4
DRILL BIT SHORT 4.0 (BIT) ×2
DRILL STEP  6.4 (BIT) ×1 IMPLANT
DRSG MEPILEX BORDER 4X4 (GAUZE/BANDAGES/DRESSINGS) ×1 IMPLANT
DRSG MEPILEX BORDER 4X8 (GAUZE/BANDAGES/DRESSINGS) ×3 IMPLANT
DRSG PAD ABDOMINAL 8X10 ST (GAUZE/BANDAGES/DRESSINGS) ×4 IMPLANT
DURAPREP 26ML APPLICATOR (WOUND CARE) ×2 IMPLANT
ELECT REM PT RETURN 9FT ADLT (ELECTROSURGICAL) ×2
ELECTRODE REM PT RTRN 9FT ADLT (ELECTROSURGICAL) ×1 IMPLANT
FACESHIELD LNG OPTICON STERILE (SAFETY) ×1 IMPLANT
GAUZE XEROFORM 5X9 LF (GAUZE/BANDAGES/DRESSINGS) ×2 IMPLANT
GLOVE BIO SURGEON STRL SZ8 (GLOVE) ×2 IMPLANT
GLOVE BIOGEL PI IND STRL 8 (GLOVE) ×1 IMPLANT
GLOVE BIOGEL PI INDICATOR 8 (GLOVE) ×1
GLOVE ORTHO TXT STRL SZ7.5 (GLOVE) ×2 IMPLANT
GOWN PREVENTION PLUS LG XLONG (DISPOSABLE) IMPLANT
GOWN PREVENTION PLUS XLARGE (GOWN DISPOSABLE) ×2 IMPLANT
GOWN STRL NON-REIN LRG LVL3 (GOWN DISPOSABLE) ×2 IMPLANT
GUIDE PIN 3.2MM (MISCELLANEOUS) ×2
GUIDE PIN ORTH 343X3.2XBRAD (MISCELLANEOUS) IMPLANT
KIT BASIN OR (CUSTOM PROCEDURE TRAY) ×2 IMPLANT
KIT ROOM TURNOVER OR (KITS) ×2 IMPLANT
MANIFOLD NEPTUNE II (INSTRUMENTS) ×2 IMPLANT
NAIL FEMORAL 11.5X34RT (Nail) ×2 IMPLANT
NS IRRIG 1000ML POUR BTL (IV SOLUTION) ×2 IMPLANT
PACK GENERAL/GYN (CUSTOM PROCEDURE TRAY) ×2 IMPLANT
PAD ARMBOARD 7.5X6 YLW CONV (MISCELLANEOUS) ×6 IMPLANT
PAD CAST 4YDX4 CTTN HI CHSV (CAST SUPPLIES) ×2 IMPLANT
PADDING CAST COTTON 4X4 STRL (CAST SUPPLIES) ×4
SCREW 6.4X85 (Screw) ×1 IMPLANT
SCREW LOCKING 5.0X80 (Screw) ×1 IMPLANT
SCREW TRIGEN LOW PROF 5.0X35 (Screw) ×1 IMPLANT
STAPLER VISISTAT 35W (STAPLE) ×2 IMPLANT
SUT VIC AB 0 CT1 27 (SUTURE) ×2
SUT VIC AB 0 CT1 27XBRD ANBCTR (SUTURE) ×2 IMPLANT
SUT VIC AB 2-0 CT1 27 (SUTURE) ×2
SUT VIC AB 2-0 CT1 TAPERPNT 27 (SUTURE) ×2 IMPLANT
TOWEL OR 17X24 6PK STRL BLUE (TOWEL DISPOSABLE) ×2 IMPLANT
TOWEL OR 17X26 10 PK STRL BLUE (TOWEL DISPOSABLE) ×2 IMPLANT
WATER STERILE IRR 1000ML POUR (IV SOLUTION) ×2 IMPLANT

## 2013-08-06 NOTE — H&P (Addendum)
[facility] History and Physical  Elmire Amrein ZOX:[id number] DOB: [birth year] DOA: 08/05/2013  Referring physician:  PCP: Selinda Flavin, MD  Specialists:   Chief Complaint: Fall with Right Hip Pain  HPI: [name] [name] is a 72 y.o. female who was shopping in the afternoon and she heard  and felt a "Pop" in her Right Hip and her legs became weak and she fell down.  She denies having any syncope associated with the fall.   She was brought to the ED and evaluated and was found on imaging to have a closed Right proximal femur fracture.  Dr. Magnus Ivan of Orthopedics was consulted by the EDP and  plans were made for surgery in the AM.     Review of Systems: The patient denies anorexia, fever, chills, headaches, weight loss, vision loss, diplopia, dizziness, decreased hearing, rhinitis, hoarseness, chest pain, syncope, dyspnea on exertion, peripheral edema, balance deficits, cough, hemoptysis, abdominal pain, nausea, vomiting, diarrhea, constipation, hematemesis, melena, hematochezia, severe indigestion/heartburn, dysuria, hematuria, incontinence, muscle weakness, suspicious skin lesions, transient blindness, difficulty walking, depression, unusual weight change, abnormal bleeding, enlarged lymph nodes, angioedema, and breast masses.    Past Medical History  Diagnosis Date  . COPD (chronic obstructive pulmonary disease)   . Hypertension   . Diabetes mellitus without complication   . Memory loss   . Speech disorder   . High cholesterol   . Thyroid disease     hypothyroid    Past Surgical History  Procedure Laterality Date  . Tonsillectomy    . Breast cyst excision Right 1960  . Rhinoplasty  [id number]  . Cervical fusion  2008  . Right ankle surgery    . Cataract surgery Left 06/08/2011    Prior to Admission medications   Medication Sig Start Date End Date Taking? Authorizing Provider  B Complex-C (SUPER B COMPLEX PO) Take 1 tablet by mouth daily.   Yes Historical Provider, MD   Calcium Carbonate (CALTRATE 600 PO) Take by mouth. Take 3 times a week   Yes Historical Provider, MD  Cholecalciferol (VITAMIN D3) 1000 UNITS CAPS Take 100 Units by mouth daily.   Yes Historical Provider, MD  citalopram (CELEXA) 20 MG tablet Take 20 mg by mouth daily.    Yes Historical Provider, MD  dipyridamole-aspirin (AGGRENOX) 200-25 MG per 12 hr capsule Take 1 capsule by mouth 2 (two) times daily.   Yes Historical Provider, MD  donepezil (ARICEPT) 10 MG tablet Take 10 mg by mouth daily.   Yes Historical Provider, MD  fesoterodine (TOVIAZ) 4 MG TB24 Take 4 mg by mouth daily.   Yes Historical Provider, MD  levothyroxine (LEVOTHROID) 100 MCG tablet Take 100 mcg by mouth daily before breakfast.   Yes Historical Provider, MD  MAGNESIUM CITRATE PO Take 400 mg by mouth daily.   Yes Historical Provider, MD  olmesartan-hydrochlorothiazide (BENICAR HCT) 40-12.5 MG per tablet Take 1 tablet by mouth daily.   Yes Historical Provider, MD  Pediatric Multivitamins-Fl (CHEWABLE MULTIVITE/FL PO) Take 1 tablet by mouth daily.    Yes Historical Provider, MD  simvastatin (ZOCOR) 10 MG tablet Take 10 mg by mouth at bedtime.   Yes Historical Provider, MD  VENTOLIN HFA 108 (90 BASE) MCG/ACT inhaler Inhale 1 puff into the lungs every 4 (four) hours as needed.  02/02/13  Yes Historical Provider, MD    Allergies  Allergen Reactions  . Crestor [Rosuvastatin]   . Lipitor [Atorvastatin]   . Penicillins   . Red Yeast Rice [Cholestin]     Social  History:  reports that she has quit smoking. She does not have any smokeless tobacco history on file. She reports that she does not drink alcohol or use illicit drugs.     Family History  Problem Relation Age of Onset  . Parkinsonism Father       Physical Exam:  GEN:  Pleasant Elderly Obese  72 y.o. Caucasian female  examined  and in no acute distress; cooperative with exam Filed Vitals:   08/05/13 2015 08/05/13 2313 08/05/13 2315 08/06/13 0100  BP: 112/52  123/54  130/59  Pulse: 83  86 87  Temp:    98 F (36.7 C)  TempSrc:    Oral  Resp: 23  19 20   Height:    5\' 3"  (1.6 m)  Weight:    56.3 kg (124 lb 1.9 oz)  SpO2: 94% 95% 95% 94%   Blood pressure 130/59, pulse 87, temperature 98 F (36.7 C), temperature source Oral, resp. rate 20, height 5\' 3"  (1.6 m), weight 56.3 kg (124 lb 1.9 oz), SpO2 94.00%. PSYCH: She is alert and oriented x4; does not appear anxious does not appear depressed; affect is normal HEENT: Normocephalic and Atraumatic, Mucous membranes pink; PERRLA; EOM intact; Fundi:  Benign;  No scleral icterus, Nares: Patent, Oropharynx: Clear, Fair dentition;  Neck:  FROM, no cervical lymphadenopathy nor thyromegaly or carotid bruit; no JVD; Breasts:: Not examined CHEST WALL: No tenderness CHEST: Normal respiration, clear to auscultation bilaterally HEART: Regular rate and rhythm; no murmurs rubs or gallops BACK: No kyphosis or scoliosis; no CVA tenderness ABDOMEN: Positive Bowel Sounds, Obese, soft non-tender; no masses, no organomegaly, no pannus; no intertriginous candida. Rectal Exam: Not done EXTREMITIES: No cyanosis, clubbing or edema; no ulcerations. Genitalia: not examined PULSES: 2+ and symmetric SKIN: Normal hydration no rash or ulceration CNS: Cranial nerves 2-12 grossly intact no focal neurologic deficit    Labs on Admission:  Basic Metabolic Panel:  Recent Labs Lab 08/05/13 0001 08/05/13 2138  NA 136 138  K 4.6 4.3  CL 100 104  CO2 23  --   GLUCOSE 108* 120*  BUN 48* 46*  CREATININE 1.60* 1.80*  CALCIUM 9.4  --    Liver Function Tests: No results found for this basename: AST, ALT, ALKPHOS, BILITOT, PROT, ALBUMIN,  in the last 168 hours No results found for this basename: LIPASE, AMYLASE,  in the last 168 hours No results found for this basename: AMMONIA,  in the last 168 hours CBC:  Recent Labs Lab 08/05/13 2138 08/05/13 2218  WBC  --  21.3*  NEUTROABS  --  18.6*  HGB 20.4* 8.6*  HCT 60.0* 26.4*  MCV   --  88.9  PLT  --  484*   Cardiac Enzymes: No results found for this basename: CKTOTAL, CKMB, CKMBINDEX, TROPONINI,  in the last 168 hours  BNP (last 3 results) No results found for this basename: PROBNP,  in the last 8760 hours CBG: No results found for this basename: GLUCAP,  in the last 168 hours  Radiological Exams on Admission: Dg Chest 1 View  08/05/2013   CLINICAL DATA:  Fall, hip pain  EXAM: CHEST - 1 VIEW  COMPARISON:  11/14/2011  FINDINGS: Right upper lobe opacity. While possibly infectious/inflammatory, in the absence of infectious symptoms, underlying mass should be considered.  No pleural effusion or pneumothorax.  Cardiomegaly.  Cervical spine fixation hardware.  IMPRESSION: Right upper lobe opacity. In the absence of infectious symptoms, consider CT chest with contrast to exclude underlying mass.  Electronically Signed   By: Charline Bills M.D.   On: 08/05/2013 21:18   Dg Hip Complete Right  08/05/2013   CLINICAL DATA:  Trauma/fall, right hip deformity  EXAM: RIGHT HIP - COMPLETE 2+ VIEW  COMPARISON:  None.  FINDINGS: Subtrochanteric right proximal femur fracture. Foreshortening with varus angulation.  Associated lucency/irregularity along the fracture site raises the possibility of an underlying pathologic lesion. Mild patchy sclerosis with associated cortical thickening is present in this region on recent radiographs, although no definite underlying lesion is visualized on the study.  Degenerative changes of the bilateral hips.  Visualized bony pelvis appears intact.  IMPRESSION: Subtrochanteric right proximal femur fracture.  Although not definitely visualized on the prior study, an underlying pathologic lesion is not excluded.   Electronically Signed   By: Charline Bills M.D.   On: 08/05/2013 21:14     EKG: Independently reviewed. 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      Assessment/Plan Principal Problem:   Closed right hip  fracture Active Problems:   Borderline diabetes mellitus   Anemia, unspecified   Essential hypertension, benign   Other and unspecified hyperlipidemia   Hypothyroid   COPD   Memory Impairment     1.   Closed Right hip Fracture-  Seen by ortho Dr. Magnus Ivan in ED, and plan for Surgery in AM.   NPO after Midnight, and IV Dilaudid PRN Pain.   Hold Aggrenox Rx.    2.   DM2- Diet controlled at home, SSI coverage PRN and Check HbA1c level.    3.   Anemia-  Send Anemia Panel.   Send type and Screen for Surgical preparation.    4.   HTN-  On  Benicar/hctz at home, resume when taking PO, IV Hydralazine PRN while NPO.     5.    Hyperlipidemia- on Simvastatin,  REsume when taking PO.    6.    Hypothyroid-  Resume Levothyroxine when taking PO.    7.    Memory Impairment-  Resume Donezepril when taking PO.   8.    COPD - Stable , Albuterol nebs PRN.     9.    DVT propylaxis with SCDs.          Code Status:      FULL CODE Family Communication:   No Family Present  Disposition Plan:     Inpatient  Time spent:  31 Minutes  Ron Parker [facility] Pager [phone]  If 7PM-7AM, please contact night-coverage [email or web address] Password [facility] 08/06/2013, 3:15 AM

## 2013-08-06 NOTE — Progress Notes (Signed)
Patient ID: [name] [name], female   DOB: 09/11/1940, 72 y.o.   MRN: [id number] Spoke again this am with the patient and she understands that her right femur fracture is severely displaced and that we are recommending surgery for today.

## 2013-08-06 NOTE — Preoperative (Signed)
Beta Blockers   Reason not to administer Beta Blockers:Not Applicable 

## 2013-08-06 NOTE — Transfer of Care (Signed)
Immediate Anesthesia Transfer of Care Note  Patient: [name] [name]  Procedure(s) Performed: Procedure(s): INTRAMEDULLARY (IM) NAIL FEMORAL (Right)  Patient Location: PACU  Anesthesia Type:General  Level of Consciousness: awake, alert  and oriented  Airway & Oxygen Therapy: Patient Spontanous Breathing and Patient connected to nasal cannula oxygen  Post-op Assessment: Report given to PACU RN, Post -op Vital signs reviewed and stable and Patient moving all extremities X 4  Post vital signs: Reviewed and stable  Complications: No apparent anesthesia complications

## 2013-08-06 NOTE — Progress Notes (Signed)
Orthopedic Tech Progress Note Patient Details:  [name] [name] 12-28-1940 [id number]  Patient ID: [name] [name], female   DOB: [birth year], 72 y.o.   MRN: [id number] Trapeze bar patient helper  Nikki Dom 08/06/2013, 2:52 PM

## 2013-08-06 NOTE — Progress Notes (Signed)
I have seen and assessed patient and agree with Dr. Lovell Sheehan assessment and plan. Patient alert. Patient is status post IM nail right femur. Patient currently on clear liquids. We'll resume patient's Synthroid and Celexa. Continue to hold blood pressure medications for now secondary to borderline BP. Follow.

## 2013-08-06 NOTE — Anesthesia Procedure Notes (Signed)
Procedure Name: Intubation Date/Time: 08/06/2013 10:47 AM Performed by: Elon Alas Pre-anesthesia Checklist: Patient identified, Timeout performed, Emergency Drugs available, Suction available and Patient being monitored Patient Re-evaluated:Patient Re-evaluated prior to inductionOxygen Delivery Method: Circle system utilized Preoxygenation: Pre-oxygenation with 100% oxygen Intubation Type: IV induction Ventilation: Mask ventilation without difficulty Laryngoscope Size: Miller and 2 Grade View: Grade I Tube type: Oral Tube size: 7.0 mm Number of attempts: 2 Airway Equipment and Method: Stylet Placement Confirmation: positive ETCO2,  ETT inserted through vocal cords under direct vision and breath sounds checked- equal and bilateral Secured at: 21 cm Tube secured with: Tape Dental Injury: Teeth and Oropharynx as per pre-operative assessment

## 2013-08-06 NOTE — Anesthesia Postprocedure Evaluation (Signed)
  Anesthesia Post-op Note  Patient: [name] [name]  Procedure(s) Performed: Procedure(s): INTRAMEDULLARY (IM) NAIL FEMORAL (Right)  Patient Location: PACU  Anesthesia Type:General  Level of Consciousness: awake, alert , oriented and patient cooperative  Airway and Oxygen Therapy: Patient Spontanous Breathing and Patient connected to nasal cannula oxygen  Post-op Pain: mild  Post-op Assessment: Post-op Vital signs reviewed, Patient's Cardiovascular Status Stable, Respiratory Function Stable, Patent Airway, No signs of Nausea or vomiting and Pain level controlled  Post-op Vital Signs: Reviewed and stable  Complications: No apparent anesthesia complications

## 2013-08-06 NOTE — Brief Op Note (Signed)
08/05/2013 - 08/06/2013  12:24 PM  PATIENT:  [name] [name]  72 y.o. female  PRE-OPERATIVE DIAGNOSIS:  right subtrochateric fracture  POST-OPERATIVE DIAGNOSIS:  right subtrochateric fracture  PROCEDURE:  Procedure(s): INTRAMEDULLARY (IM) NAIL FEMORAL (Right)  SURGEON:  Surgeon(s) and Role:    * Kathryne Hitch, MD - Primary  PHYSICIAN ASSISTANT:   ASSISTANTS: none   ANESTHESIA:   general  EBL:  Total I/O In: 1225 [I.V.:900; Blood:325] Out: 225 [Urine:125; Blood:100]  BLOOD ADMINISTERED:500 CC PRBC  DRAINS: none   LOCAL MEDICATIONS USED:  NONE  SPECIMEN:  Source of Specimen:  right proximal femur reamings  DISPOSITION OF SPECIMEN:  PATHOLOGY  COUNTS:  YES  TOURNIQUET:  * No tourniquets in log *  DICTATION: .Other Dictation: Dictation Number [id number]  PLAN OF CARE: Admit to inpatient   PATIENT DISPOSITION:  PACU - hemodynamically stable.   Delay start of Pharmacological VTE agent (>24hrs) due to surgical blood loss or risk of bleeding: no

## 2013-08-06 NOTE — Anesthesia Preprocedure Evaluation (Addendum)
Anesthesia Evaluation  Patient identified by MRN, date of birth, ID band Patient awake    Reviewed: Allergy & Precautions, H&P , NPO status , Patient's Chart, lab work & pertinent test results  Airway Mallampati: II TM Distance: >3 FB     Dental  (+) Dental Advisory Given and Teeth Intact   Pulmonary COPD COPD inhaler, former smoker (quit 2010),          Cardiovascular hypertension, Pt. on medications     Neuro/Psych Memory lossTIA (aggrenox)   GI/Hepatic   Endo/Other  diabetes (diet controlledglu 87), Well Controlled  Renal/GU Renal InsufficiencyRenal disease (creat 1.45)     Musculoskeletal   Abdominal   Peds  Hematology  (+) Blood dyscrasia (Hb 8.6), anemia ,   Anesthesia Other Findings   Reproductive/Obstetrics                         Anesthesia Physical Anesthesia Plan  ASA: III  Anesthesia Plan: General   Post-op Pain Management:    Induction: Intravenous  Airway Management Planned: Oral ETT  Additional Equipment:   Intra-op Plan:   Post-operative Plan: Extubation in OR  Informed Consent: I have reviewed the patients History and Physical, chart, labs and discussed the procedure including the risks, benefits and alternatives for the proposed anesthesia with the patient or authorized representative who has indicated his/her understanding and acceptance.   Dental advisory given  Plan Discussed with: Anesthesiologist and Surgeon  Anesthesia Plan Comments:         Anesthesia Quick Evaluation

## 2013-08-07 ENCOUNTER — Inpatient Hospital Stay (HOSPITAL_COMMUNITY): Payer: Medicare Other

## 2013-08-07 DIAGNOSIS — R9389 Abnormal findings on diagnostic imaging of other specified body structures: Secondary | ICD-10-CM | POA: Diagnosis present

## 2013-08-07 DIAGNOSIS — D72829 Elevated white blood cell count, unspecified: Secondary | ICD-10-CM | POA: Diagnosis present

## 2013-08-07 DIAGNOSIS — N179 Acute kidney failure, unspecified: Secondary | ICD-10-CM

## 2013-08-07 DIAGNOSIS — R918 Other nonspecific abnormal finding of lung field: Secondary | ICD-10-CM

## 2013-08-07 LAB — BASIC METABOLIC PANEL
BUN: 30 mg/dL — ABNORMAL HIGH (ref 6–23)
Chloride: 103 mEq/L (ref 96–112)
GFR calc Af Amer: 53 mL/min — ABNORMAL LOW (ref >90)
GFR calc non Af Amer: 46 mL/min — ABNORMAL LOW (ref >90)
Glucose, Bld: 100 mg/dL — ABNORMAL HIGH (ref 70–99)
Potassium: 4.1 mEq/L (ref 3.5–5.1)
Sodium: 136 mEq/L (ref 135–145)

## 2013-08-07 LAB — GLUCOSE, CAPILLARY
Glucose-Capillary: 128 mg/dL — ABNORMAL HIGH (ref 70–99)
Glucose-Capillary: 118 mg/dL — ABNORMAL HIGH (ref 70–99)
Glucose-Capillary: 139 mg/dL — ABNORMAL HIGH (ref 70–99)
Glucose-Capillary: 110 mg/dL — ABNORMAL HIGH (ref 70–99)

## 2013-08-07 LAB — CBC
HCT: 25.3 % — ABNORMAL LOW (ref 36.0–46.0)
Hemoglobin: 8.3 g/dL — ABNORMAL LOW (ref 12.0–15.0)
MCH: 29.4 pg (ref 26.0–34.0)
MCHC: 32.8 g/dL (ref 30.0–36.0)
Platelets: 407 10*3/uL — ABNORMAL HIGH (ref 150–400)
WBC: 22.8 10*3/uL — ABNORMAL HIGH (ref 4.0–10.5)

## 2013-08-07 MED ORDER — INSULIN ASPART 100 UNIT/ML ~~LOC~~ SOLN
0.0000 [IU] | Freq: Three times a day (TID) | SUBCUTANEOUS | Status: DC
  Administered 2013-08-08: 1 [IU] via SUBCUTANEOUS

## 2013-08-07 MED ORDER — HYDROCODONE-ACETAMINOPHEN 5-325 MG PO TABS: 1.0000 | ORAL_TABLET | ORAL | Status: AC | PRN

## 2013-08-07 NOTE — Evaluation (Signed)
Occupational Therapy Evaluation Patient Details Name: [name] [name] MRN: [id number] DOB: Jul 19, 1941 Today's Date: 08/07/2013 Time: 4098-1191 OT Time Calculation (min): 20 min  OT Assessment / Plan / Recommendation History of present illness INTRAMEDULLARY (IM) NAIL FEMORAL (Right)   Clinical Impression   Pt presents with below problem list. Pt independent with ADLs. PTA. Pt will benefit from acute OT to increase independence prior to d/c. Recommending SNF for additional rehab prior to d/c home.     OT Assessment  Patient needs continued OT Services    Follow Up Recommendations  SNF;Supervision/Assistance - 24 hour    Barriers to Discharge      Equipment Recommendations  Other (comment) (defer to next venue)    Recommendations for Other Services    Frequency  Min 2X/week    Precautions / Restrictions Precautions Precautions: Fall Restrictions Weight Bearing Restrictions: Yes RLE Weight Bearing: Partial weight bearing RLE Partial Weight Bearing Percentage or Pounds: 50   Pertinent Vitals/Pain Pain in hip with varying numbers given. No pain while sitting, but pain with movement. Repositioned and increased activity.     ADL  Eating/Feeding: Independent Where Assessed - Eating/Feeding: Chair Grooming: Set up Where Assessed - Grooming: Supported sitting Upper Body Bathing: Set up Where Assessed - Upper Body Bathing: Supported sitting Lower Body Bathing: Moderate assistance Where Assessed - Lower Body Bathing: Supported sit to stand;Lean right and/or left Upper Body Dressing: Set up Where Assessed - Upper Body Dressing: Supported sitting Lower Body Dressing: Maximal assistance Where Assessed - Lower Body Dressing: Supported sit to Pharmacist, hospital: Minimal assistance;Moderate assistance Toilet Transfer Method: Sit to stand;Stand pivot Toilet Transfer Equipment: Bedside commode Toileting - Clothing Manipulation and Hygiene: Minimal assistance Where Assessed -  Toileting Clothing Manipulation and Hygiene: Sit on 3-in-1 or toilet Tub/Shower Transfer Method: Not assessed Equipment Used: Gait belt;Rolling walker;Long-handled shoe horn;Long-handled sponge;Reacher ADL Comments: Educated on AE for LB ADLs. Pt practiced with sockaid.     OT Diagnosis: Acute pain  OT Problem List: Decreased strength;Decreased range of motion;Decreased activity tolerance;Impaired balance (sitting and/or standing);Decreased knowledge of use of DME or AE;Decreased knowledge of precautions;Pain;Cardiopulmonary status limiting activity OT Treatment Interventions: Self-care/ADL training;DME and/or AE instruction;Therapeutic activities;Patient/family education;Balance training   OT Goals(Current goals can be found in the care plan section) Acute Rehab OT Goals Patient Stated Goal: get back to normal OT Goal Formulation: With patient Time For Goal Achievement: 08/14/13 Potential to Achieve Goals: Good ADL Goals Pt Will Perform Grooming: with supervision;standing Pt Will Perform Lower Body Bathing: with supervision;with adaptive equipment;sit to/from stand Pt Will Perform Lower Body Dressing: with supervision;with adaptive equipment;sit to/from stand Pt Will Transfer to Toilet: with supervision;ambulating;bedside commode Pt Will Perform Toileting - Clothing Manipulation and hygiene: with supervision;sit to/from stand  Visit Information  Last OT Received On: 08/07/13 Assistance Needed: +2 (helpful for chair push) History of Present Illness: INTRAMEDULLARY (IM) NAIL FEMORAL (Right)       Prior Functioning     Home Living Family/patient expects to be discharged to:: Skilled nursing facility Prior Function Level of Independence: Independent with assistive device(s) (cane occassionally) Communication Communication: No difficulties         Vision/Perception     Cognition  Cognition Arousal/Alertness: Awake/alert Behavior During Therapy: WFL for tasks  assessed/performed Overall Cognitive Status: Within Functional Limits for tasks assessed    Extremity/Trunk Assessment Upper Extremity Assessment Upper Extremity Assessment: Overall WFL for tasks assessed Lower Extremity Assessment Lower Extremity Assessment: Defer to PT evaluation     Mobility Bed Mobility  Bed Mobility: Not assessed Transfers Transfers: Sit to Stand;Stand to Sit Sit to Stand: 4: Min assist;From chair/3-in-1 Stand to Sit: 4: Min assist;To chair/3-in-1;4: Min guard Details for Transfer Assistance: Min/Mod A for stand pivot transfer. Cues for precautions.      Exercise     Balance     End of Session OT - End of Session Equipment Utilized During Treatment: Gait belt;Rolling walker;Oxygen Activity Tolerance: Patient tolerated treatment well Patient left: in chair;with call bell/phone within reach;with family/visitor present  GO     Earlie Raveling OTR/L [phone] 08/07/2013, 6:22 PM

## 2013-08-07 NOTE — Progress Notes (Addendum)
Clinical Social Work Department CLINICAL SOCIAL WORK PLACEMENT NOTE 08/07/2013  Patient:  Palo,Dawnisha  Account Number:  [id number] Admit date:  08/05/2013  Clinical Social Worker:  Jetta Lout, [place]  Date/time:  08/07/2013 06:50 PM  Clinical Social Work is seeking post-discharge placement for this patient at the following level of care:   SKILLED NURSING   (*CSW will update this form in Epic as items are completed)   08/07/2013  Patient/family provided with [facility] Department of Clinical Social Work's list of facilities offering this level of care within the geographic area requested by the patient (or if unable, by the patient's family).  08/07/2013  Patient/family informed of their freedom to choose among providers that offer the needed level of care, that participate in Medicare, Medicaid or managed care program needed by the patient, have an available bed and are willing to accept the patient.  08/07/2013  Patient/family informed of [facility]' ownership interest in [facility], as well as of the fact that they are under no obligation to receive care at this facility.  PASARR submitted to EDS on 08/06/2013 PASARR number received from EDS on 08/06/2013  FL2 transmitted to all facilities in geographic area requested by pt/family on  08/07/2013 FL2 transmitted to all facilities within larger geographic area on   Patient informed that his/her managed care company has contracts with or will negotiate with  certain facilities, including the following:     Patient/family informed of bed offers received:  08/08/2013 Patient chooses bed at [facility] Physician recommends and patient chooses bed at    Patient to be transferred to   [facility] on  08/09/2013 Patient to be transferred to facility by [facility]  The following physician request were entered in Epic:   Additional Comments:

## 2013-08-07 NOTE — Evaluation (Signed)
Physical Therapy Evaluation Patient Details Name: [name] [name] MRN: [id number] DOB: 20-Jun-1941 Today's Date: 08/07/2013 Time: 4098-1191 PT Time Calculation (min): 33 min  PT Assessment / Plan / Recommendation History of Present Illness  INTRAMEDULLARY (IM) NAIL FEMORAL (Right)  Clinical Impression  Patient is s/p above surgery resulting in functional limitations due to the deficits listed below (see PT Problem List). There is concern that this may be a pathological fracture;  Patient will benefit from skilled PT to increase their independence and safety with mobility to allow discharge to the venue listed below.   Also noteworthy is that pt reports back problems that have resulted in pain down RLE for the past few months; They also report recent weight loss      PT Assessment  Patient needs continued PT services    Follow Up Recommendations  CIR    Does the patient have the potential to tolerate intense rehabilitation      Barriers to Discharge        Equipment Recommendations  Rolling walker with 5" wheels;3in1 (PT)    Recommendations for Other Services Rehab consult   Frequency Min 5X/week    Precautions / Restrictions Precautions Precautions: Fall Restrictions Weight Bearing Restrictions: Yes RLE Weight Bearing: Partial weight bearing RLE Partial Weight Bearing Percentage or Pounds: 50   Pertinent Vitals/Pain 3/10 at rest post transfer 10/10 during transfer; patient repositioned for comfort  Session conducted on RA, and noted pt's O2 sats were in low 90s; restarted O2 via [place] 2 liters; RN aware      Mobility  Bed Mobility Bed Mobility: Supine to Sit;Sitting - Scoot to Edge of Bed Supine to Sit: 1: +2 Total assist;With rails;HOB elevated Supine to Sit: Patient Percentage: 40% Sitting - Scoot to Edge of Bed: 3: Mod assist (light mod) Details for Bed Mobility Assistance: Cues for techniquePain limiting ability to push up to sit, required total assist to elevate  tunk from bed; Once up, pt demonstrated good use of UE push to scoot to EOB -- light mod assist (used bed pad to square hips at EOB) Transfers Transfers: Sit to Stand;Stand to Sit Sit to Stand: 3: Mod assist;From bed Stand to Sit: 4: Min assist;To chair/3-in-1 Details for Transfer Assistance: Cues for safety, hand placement and technique Ambulation/Gait Ambulation/Gait Assistance: 1: +2 Total assist Ambulation/Gait: Patient Percentage: 50% Ambulation Distance (Feet): 4 Feet Assistive device: Rolling walker Ambulation/Gait Assistance Details: Cues and phsyical assist for gait sequence and to push down into RW with bil IEs for 50%PWBing R Gait Pattern: Step-to pattern    Exercises     PT Diagnosis: Difficulty walking;Acute pain  PT Problem List: Decreased strength;Decreased range of motion;Decreased activity tolerance;Decreased balance;Decreased mobility;Decreased knowledge of use of DME;Pain;Decreased knowledge of precautions PT Treatment Interventions: DME instruction;Gait training;Stair training;Functional mobility training;Therapeutic activities;Therapeutic exercise;Balance training;Patient/family education     PT Goals(Current goals can be found in the care plan section) Acute Rehab PT Goals Patient Stated Goal: feel better PT Goal Formulation: With patient Time For Goal Achievement: 08/21/13 Potential to Achieve Goals: Good  Visit Information  Last PT Received On: 08/07/13 Assistance Needed: +2 (helpful for chair push) History of Present Illness: INTRAMEDULLARY (IM) NAIL FEMORAL (Right)       Prior Functioning  Home Living Family/patient expects to be discharged to:: Inpatient rehab Prior Function Level of Independence: Independent;Independent with assistive device(s) (Cane occasionally) Communication Communication: No difficulties    Cognition  Cognition Arousal/Alertness: Awake/alert Behavior During Therapy: WFL for tasks assessed/performed Overall Cognitive  Status:  Within Functional Limits for tasks assessed    Extremity/Trunk Assessment Upper Extremity Assessment Upper Extremity Assessment: Defer to OT evaluation;Overall WFL for tasks assessed (Ntoed tremor) Lower Extremity Assessment Lower Extremity Assessment: RLE deficits/detail RLE Deficits / Details: Decr aROM and strength, limited by pain postop   Balance    End of Session PT - End of Session Equipment Utilized During Treatment: Gait belt Activity Tolerance: Patient limited by pain Patient left: in chair;with call bell/phone within reach;with family/visitor present Nurse Communication: Mobility status  GP     Olen Pel [place], [place] [phone]  08/07/2013, 10:38 AM

## 2013-08-07 NOTE — Progress Notes (Signed)
Rehab Admissions Coordinator Note:  Patient was screened by Trish Mage for appropriateness for an Inpatient Acute Rehab Consult.  Noted PT recommending CIR.  I spoke with [employer] on site reviewer.  We are unlikely to get authorization for inpatient rehab with current diagnosis.  Therefore, at this time, we are recommending Skilled Nursing Facility.  Trish Mage 08/07/2013, 11:02 AM  I can be reached at [phone].

## 2013-08-07 NOTE — Progress Notes (Signed)
Clinical Social Work Department BRIEF PSYCHOSOCIAL ASSESSMENT 08/07/2013  Patient:  [name] [name],[name] [name]     Account Number:  [id number]     Admit date:  08/05/2013  Clinical Social Worker:  Hendricks Milo  Date/Time:  08/07/2013 06:44 PM  Referred by:  Physician  Date Referred:  08/06/2013 Referred for  SNF Placement   Other Referral:   Interview type:  Family Other interview type:    PSYCHOSOCIAL DATA Living Status:  HUSBAND Admitted from facility:   Level of care:   Primary support name:  Milaina Sher [phone] Primary support relationship to patient:  SPOUSE Degree of support available:   Very supportive.    CURRENT CONCERNS  Other Concerns:    SOCIAL WORK ASSESSMENT / PLAN Clinical Social Worker (CSW) met with patient's husband while patient was getting a CT scan. Husband reported that patient's first choice is inpatient rehab (CIR). PT is recommending inpatient rehab. Husband was agreeable to SNF search in [facility] as a back up to CIR. Husband reported that his first choice is [facility] if [facility] doesn't work out.   Assessment/plan status:  Psychosocial Support/Ongoing Assessment of Needs Other assessment/ plan:   Information/referral to community resources:    PATIENT'S/FAMILY'S RESPONSE TO PLAN OF CARE: Husband thanked CSW for visit.

## 2013-08-07 NOTE — Progress Notes (Signed)
Utilization review completed.  

## 2013-08-07 NOTE — Care Management Note (Signed)
CARE MANAGEMENT NOTE 08/07/2013  Patient:  [name] [name]   Account Number:  [id number]  Date Initiated:  08/07/2013  Documentation initiated by:  Vance Peper  Subjective/Objective Assessment:   72 yr old female s/p Right hip IM nailing     Action/Plan:   Patient will require shortterm SNF. Social worker is aware.   Anticipated DC Date:  08/08/2013   Anticipated DC Plan:  SKILLED NURSING FACILITY  In-house referral  Clinical Social Worker         Choice offered to / List presented to:             Status of service:  Completed, signed off Medicare Important Message given?   (If response is "NO", the following Medicare IM given date fields will be blank) Date Medicare IM given:   Date Additional Medicare IM given:    Discharge Disposition:  SKILLED NURSING FACILITY  Per UR Regulation:    If discussed at Long Length of Stay Meetings, dates discussed:    Comments:

## 2013-08-07 NOTE — Progress Notes (Signed)
Subjective: 1 Day Post-Op Procedure(s) (LRB): INTRAMEDULLARY (IM) NAIL FEMORAL (Right) Patient reports pain as moderate.  Labs not back yet.  She id come in with chronic anemia and received a unit of blood at the time of surgery yesterday.  Surgery went well, but the concern is that this may be a pathologic fracture.  A specimen was sent to Pathology yesterday as well.  Objective: Vital signs in last 24 hours: Temp:  [98.1 F (36.7 C)-99.9 F (37.7 C)] 99 F (37.2 C) (12/22 0635) Pulse Rate:  [84-98] 98 (12/22 0635) Resp:  [14-21] 19 (12/22 0635) BP: (101-144)/(46-71) 121/68 mmHg (12/22 0635) SpO2:  [90 %-100 %] 90 % (12/22 0635)  Intake/Output from previous day: 12/21 0701 - 12/22 0700 In: 2185 [P.O.:360; I.V.:1500; Blood:325] Out: 925 [Urine:825; Blood:100] Intake/Output this shift:     Recent Labs  08/05/13 2138 08/05/13 2218 08/06/13 0520  HGB 20.4* 8.6* 8.6*    Recent Labs  08/05/13 2218 08/06/13 0520  WBC 21.3* 19.0*  RBC 2.97* 2.94*  HCT 26.4* 26.2*  PLT 484* 501*    Recent Labs  08/05/13 0001 08/05/13 2138 08/06/13 0520  NA 136 138 138  K 4.6 4.3 4.3  CL 100 104 102  CO2 23  --  21  BUN 48* 46* 43*  CREATININE 1.60* 1.80* 1.45*  GLUCOSE 108* 120* 95  CALCIUM 9.4  --  9.4    Recent Labs  08/05/13 0001  INR 1.00    Sensation intact distally Intact pulses distally Dorsiflexion/Plantar flexion intact Incision: scant drainage  Assessment/Plan: 1 Day Post-Op Procedure(s) (LRB): INTRAMEDULLARY (IM) NAIL FEMORAL (Right) Up with therapy, 50% weight on right leg only for now; ADL's On Aggrenox, no no further blood thinner needed for DVT Will need SNF placement  Jasiah Buntin Y 08/07/2013, 7:34 AM

## 2013-08-07 NOTE — Progress Notes (Signed)
Physical medicine and rehabilitation consult requested. [employer] Medicare at this time will not justify inpatient rehabilitation services with current diagnosis. Recommendations are made for skilled nursing facility as noted per rehabilitation admission coordinator. Will hold formal rehabilitation consult at this time with recommendations for skilled nursing facility

## 2013-08-07 NOTE — Op Note (Signed)
[name] [name], STENGLEIN               ACCOUNT NO.:  [id number]  MEDICAL RECORD NO.:  [id number]  LOCATION:  5N19C                        FACILITY:  [facility]  PHYSICIAN:  Vanita Panda. Magnus Ivan, M.D.DATE OF BIRTH:  06-Sep-1940  DATE OF PROCEDURE: DATE OF DISCHARGE:                              OPERATIVE REPORT   POSTOPERATIVE DIAGNOSIS:  Right subtrochanteric femur fracture, questionable traumatic versus pathologic.  POSTOPERATIVE DIAGNOSIS:  Right subtrochanteric femur fracture, questionable traumatic versus pathologic.  PROCEDURE:  Intramedullary nail placement right femur.  IMPLANTS:  Smith and Nephew Recon nail measuring 11.5 mm x 34-cm with two Recon screws proximally and one distal interlock.  FINDINGS:  Transverse fracture of the right proximal femur.  Question pathologic with local tissue and reamings sent for permanent pathologic identification.  SURGEON:  Vanita Panda. Magnus Ivan, M.D.  ANESTHESIA:  General.  ANTIBIOTICS:  900 mg IV clindamycin.  BLOOD LOSS:  100 mL.  FLUIDS:  One unit packed red blood cells.  COMPLICATIONS:  None.  INDICATIONS:  Ms. Hidrogo is a 72 year old female who has had some right leg and hip pain for over a month now.  She also though has back issues and has had some epidural steroid injections, which have helped.  She had x-rays of her hip just July 19, 2013, did not show any significant questionable findings.  Yesterday when she was out shopping she felt a pop in her hip and then fell and was brought to the [facility] Emergency Room where she was found to have a subtrochanteric femur fracture.  It was almost transverse in nature and the way the bone looks in this area there is question of whether this is pathologic.  I spoke to her and her family in length about the need for surgery due to her pain and to improve alignment, but she would certainly need a significant workup.  They understand the risks and benefits of surgery. She is an  anemic preoperative, so she is going to get some blood during surgery and the General Medicine Service is following her as well.  PROCEDURE DESCRIPTION:  After informed consent was obtained, appropriate right hip was marked.  She was brought to the operating room, placed supine on the operating table.  General anesthesia was then obtained.  A perineal post was placed on the fracture table and then her right operative leg was placed in an in line skeletal traction.  Her left hip was flexed and extended with a stirrup out of the field with appropriate padding in the popliteal area.  We then assessed the fracture under direct fluoroscopy and with traction, I was able to get her aligned near anatomic and it again showed worrisome that this could be metastatic disease.  We then prepped the entire right hip and leg with DuraPrep and sterile drapes.  A time-out was called, she was identified as correct patient and correct right femur.  I then made an incision proximal to the level of the greater trochanter and dissected down to the tip of the greater trochanter.  I placed a guide pin in an antegrade fashion down to the lesser trochanteric area, verified under fluoroscopy.  We then placed a long guide rod, and  took tissues from the initiating reamer as well as reamings as we sequentially reamed the femur from 9-mm up to 13- mm.  We then chose a 11 mm x 34-cm femoral nail and placed this over the guidewire in antegrade fashion and removed the guidewire.  Using the Outrigger Guide, I then placed 2 Recon screws traversing the femoral neck and head, and then through a separate incision distally placed an interlock from lateral to medial.  This was again all accomplished under direct fluoroscopy.  We then irrigated all soft tissue wounds and closed the deep tissue with 0 Vicryl followed by 2-0 Vicryl on the subcutaneous tissue and all incisions in staples on the skin.  She was then taken off the  fracture table, awakened, extubated, and to recovery room in stable condition.  All final counts were correct and there were no complications noted.     Vanita Panda. Magnus Ivan, M.D.     CYB/MEDQ  D:  08/06/2013  T:  08/07/2013  Job:  [id number]

## 2013-08-07 NOTE — Progress Notes (Signed)
[facility] PROGRESS NOTE  [name] [name] ZOX:[id number] DOB: [birth year] DOA: 08/05/2013 PCP: Selinda Flavin, MD  Assessment/Plan: #1 right subtrochanteric femur fracture Etiology includes traumatic versus pathologic. Patient is status post IM nail placement to the right femur per Dr. Magnus Ivan 08/06/2013. Pain management. Per orthopedics.  #2 abnormal chest x-ray Chest x-ray from admission with right upper lobe opacity.patient with no respiratory symptoms. Patient however with a leukocytosis. Will get a CT of the chest for further evaluation.  #3 acute renal failure Likely secondary to prerenal azotemia. Benicar and HCTZ d/c'd.Renal function improvement with hydration. Follow.  #4 anemia Patient with no overt GI bleed. Patient is status post 1 unit packed red blood cells perioperatively. H&H is stable. Follow.  #5 well controlled diabetes mellitus Hemoglobin A1c 6.2.  Continue sliding scale insulin.  #6 hypothyroidism Continue Synthroid.  #7 history of TIA/dysarthria/memory loss Continue Aggrenox for secondary stroke prevention. Continue statin. Continue Aricept.   #8 COPD Stable.  #9 hypertension Benicar and HCTZ on hold secondary to problem #3. Blood pressure currently stable. Will monitor off blood pressure medications for now.   #10 leukocytosis Likely reactive. Urinalysis is negative.chest x-ray with no acute infiltrate however does have right upper lobe opacity. Patient with no respiratory symptoms. CT chest pending. No need for narcotics at this time.  #11 Prophylaxis Agrrenox for DVT prophylaxis  Code Status: full Family Communication: updated patient and daughter at bedside. Disposition Plan: probable skilled nursing facility   Consultants:  Orthopedics: Dr. Magnus Ivan 08/05/2013  Procedures:  1 unit packed red blood cells 08/06/2013  Status post IM nail placement right femur 08/06/2013 per Dr. Magnus Ivan  Chest x-ray 08/05/2013  X-ray of the right  hip 08/05/2013  Antibiotics:  none  HPI/Subjective: Patient with no complaints. Patient with chronic dysarthric speech  Objective: Filed Vitals:   08/07/13 0635  BP: 121/68  Pulse: 98  Temp: 99 F (37.2 C)  Resp: 19    Intake/Output Summary (Last 24 hours) at 08/07/13 1205 Last data filed at 08/07/13 1110  Gross per 24 hour  Intake    960 ml  Output    900 ml  Net     60 ml   Filed Weights   08/05/13 1916 08/06/13 0100  Weight: 63.504 kg (140 lb) 56.3 kg (124 lb 1.9 oz)    Exam:   General:  nad  Cardiovascular: rrr  Respiratory: ctab  Abdomen: soft, nontender, nondistended, positive bowel sounds.  Musculoskeletal: no C/C/E   Data Reviewed: Basic Metabolic Panel:  Recent Labs Lab 08/05/13 0001 08/05/13 2138 08/06/13 0520 08/07/13 0753  NA 136 138 138 136  K 4.6 4.3 4.3 4.1  CL 100 104 102 103  CO2 23  --  21 20  GLUCOSE 108* 120* 95 100*  BUN 48* 46* 43* 30*  CREATININE 1.60* 1.80* 1.45* 1.15*  CALCIUM 9.4  --  9.4 8.7   Liver Function Tests: No results found for this basename: AST, ALT, ALKPHOS, BILITOT, PROT, ALBUMIN,  in the last 168 hours No results found for this basename: LIPASE, AMYLASE,  in the last 168 hours No results found for this basename: AMMONIA,  in the last 168 hours CBC:  Recent Labs Lab 08/05/13 2138 08/05/13 2218 08/06/13 0520 08/07/13 0753  WBC  --  21.3* 19.0* 22.8*  NEUTROABS  --  18.6*  --   --   HGB 20.4* 8.6* 8.6* 8.3*  HCT 60.0* 26.4* 26.2* 25.3*  MCV  --  88.9 89.1 89.7  PLT  --  484* 501* 407*   Cardiac Enzymes: No results found for this basename: CKTOTAL, CKMB, CKMBINDEX, TROPONINI,  in the last 168 hours BNP (last 3 results) No results found for this basename: PROBNP,  in the last 8760 hours CBG:  Recent Labs Lab 08/06/13 1622 08/06/13 2019 08/06/13 2342 08/07/13 0638 08/07/13 1131  GLUCAP 108* 103* 128* 96 118*    Recent Results (from the past 240 hour(s))  SURGICAL PCR SCREEN     Status:  None   Collection Time    08/06/13  5:46 AM      Result Value Range Status   MRSA, PCR NEGATIVE  NEGATIVE Final   Staphylococcus aureus NEGATIVE  NEGATIVE Final   Comment:            The Xpert SA Assay (FDA     approved for NASAL specimens     in patients over 81 years of age),     is one component of     a comprehensive surveillance     program.  Test performance has     been validated by [employer] for patients greater     than or equal to 34 year old.     It is not intended     to diagnose infection nor to     guide or monitor treatment.     Studies: Dg Chest 1 View  08/05/2013   CLINICAL DATA:  Fall, hip pain  EXAM: CHEST - 1 VIEW  COMPARISON:  11/14/2011  FINDINGS: Right upper lobe opacity. While possibly infectious/inflammatory, in the absence of infectious symptoms, underlying mass should be considered.  No pleural effusion or pneumothorax.  Cardiomegaly.  Cervical spine fixation hardware.  IMPRESSION: Right upper lobe opacity. In the absence of infectious symptoms, consider CT chest with contrast to exclude underlying mass.   Electronically Signed   By: Charline Bills M.D.   On: 08/05/2013 21:18   Dg Hip Complete Right  08/05/2013   CLINICAL DATA:  Trauma/fall, right hip deformity  EXAM: RIGHT HIP - COMPLETE 2+ VIEW  COMPARISON:  None.  FINDINGS: Subtrochanteric right proximal femur fracture. Foreshortening with varus angulation.  Associated lucency/irregularity along the fracture site raises the possibility of an underlying pathologic lesion. Mild patchy sclerosis with associated cortical thickening is present in this region on recent radiographs, although no definite underlying lesion is visualized on the study.  Degenerative changes of the bilateral hips.  Visualized bony pelvis appears intact.  IMPRESSION: Subtrochanteric right proximal femur fracture.  Although not definitely visualized on the prior study, an underlying pathologic lesion is not excluded.    Electronically Signed   By: Charline Bills M.D.   On: 08/05/2013 21:14   Dg Hip Operative Right  08/06/2013   CLINICAL DATA:  Right subtrochanteric fracture.  Internal fixation.  EXAM: DG OPERATIVE RIGHT HIP  TECHNIQUE: A single spot fluoroscopic AP image of the right hip is submitted.  COMPARISON:  08/05/2013  FINDINGS: Multiple intraoperative spot images demonstrate internal fixation across the proximal right femoral shaft fracture. Near anatomic alignment. No hardware or bony complicating feature.  IMPRESSION: Internal fixation as above.   Electronically Signed   By: Charlett Nose M.D.   On: 08/06/2013 13:20    Scheduled Meds: . citalopram  20 mg Oral Daily  . dipyridamole-aspirin  1 capsule Oral BID  . docusate sodium  100 mg Oral BID  . donepezil  10 mg Oral Daily  . insulin aspart  0-9 Units Subcutaneous  TID WC  . levothyroxine  100 mcg Oral QAC breakfast  . simvastatin  10 mg Oral QHS   Continuous Infusions: . sodium chloride 75 mL/hr (08/07/13 0538)    Principal Problem:   Closed right hip fracture Active Problems:   TIA (transient ischemic attack)   Borderline diabetes mellitus   Dysarthria   Anemia, unspecified   Essential hypertension, benign   Other and unspecified hyperlipidemia   Abnormal CXR    Time spent: 30 mins    [facility] MD [facility] Pager [phone] . If 7PM-7AM, please contact night-coverage at [email or web address], password [facility] 08/07/2013, 12:05 PM  LOS: 2 days

## 2013-08-08 ENCOUNTER — Encounter (HOSPITAL_COMMUNITY): Payer: Self-pay | Admitting: Orthopaedic Surgery

## 2013-08-08 DIAGNOSIS — J189 Pneumonia, unspecified organism: Secondary | ICD-10-CM | POA: Diagnosis present

## 2013-08-08 LAB — BASIC METABOLIC PANEL
CO2: 23 mEq/L (ref 19–32)
Calcium: 8.4 mg/dL (ref 8.4–10.5)
GFR calc Af Amer: 54 mL/min — ABNORMAL LOW (ref >90)
GFR calc non Af Amer: 47 mL/min — ABNORMAL LOW (ref >90)
Sodium: 136 mEq/L (ref 135–145)

## 2013-08-08 LAB — CBC
MCH: 29.5 pg (ref 26.0–34.0)
MCHC: 32.8 g/dL (ref 30.0–36.0)
Platelets: 363 10*3/uL (ref 150–400)
RBC: 2.58 MIL/uL — ABNORMAL LOW (ref 3.87–5.11)
RDW: 13.4 % (ref 11.5–15.5)
WBC: 18.3 10*3/uL — ABNORMAL HIGH (ref 4.0–10.5)

## 2013-08-08 LAB — GLUCOSE, CAPILLARY
Glucose-Capillary: 102 mg/dL — ABNORMAL HIGH (ref 70–99)
Glucose-Capillary: 122 mg/dL — ABNORMAL HIGH (ref 70–99)

## 2013-08-08 MED ORDER — LEVOFLOXACIN IN D5W 750 MG/150ML IV SOLN
750.0000 mg | INTRAVENOUS | Status: DC
  Administered 2013-08-08: 750 mg via INTRAVENOUS
  Filled 2013-08-08 (×2): qty 150

## 2013-08-08 NOTE — Progress Notes (Signed)
[facility] PROGRESS NOTE  [name] [name] RUE:[id number] DOB: [birth year] DOA: 08/05/2013 PCP: Selinda Flavin, MD  Assessment/Plan: #1 right subtrochanteric femur fracture likely pathologic fracture. Patient is status post IM nail placement to the right femur per Dr. Magnus Ivan 08/06/2013. 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 Final results are pending. Consent will right upper lobe irregular air space disease noted on chest CT is concerning for lung cancer. Will likely need oncology workup as outpatient. Pain management. Per orthopedics.  #2 abnormal chest x-ray Chest x-ray from admission with right upper lobe opacity.patient with no respiratory symptoms. Patient however with a leukocytosis. CT chest with irregular air space disease throughout much of the right upper lobe, worrisome for pneumonia however will need repeat CT scan on followup. We'll place empirically on Levaquin for a short course of treatment.  #3 abnormal chest x-ray/abnormal CT scan/preliminary results of pathologic fracture Pulmonary results of pathologic fracture concerning for metastatic carcinoma. Chest x-ray showed right upper lobe opacity. CT scan of the chest showed an irregular air space disease in the right upper lobe. Patient with no significant respiratory symptoms. Concern for possible lung cancer. Final results of sample sent from fracture pending. Discussed case with Dr. Rowe Clack in [place] will consult on the patient for further evaluation and management,once she gets to the nursing home at [facility].  #4 acute renal failure Likely secondary to prerenal azotemia. Benicar and HCTZ d/c'd.Renal function improvement with hydration. Follow.  #5 anemia Patient with no overt GI bleed. Patient is status post 1 unit packed red blood cells perioperatively. H&H is stable. Follow H&H. Follow.  #6 well controlled diabetes mellitus Hemoglobin A1c 6.2.  Continue sliding scale insulin.  #7  hypothyroidism Continue Synthroid.  #8 history of TIA/dysarthria/memory loss Continue Aggrenox for secondary stroke prevention. Continue statin. Continue Aricept.   #9 COPD Stable.  #10 hypertension Benicar and HCTZ on hold secondary to problem #3. Blood pressure currently stable. Will monitor off blood pressure medications for now.   #11 leukocytosis Likely reactive. Urinalysis is negative.chest x-ray with no acute infiltrate however does have right upper lobe opacity. Patient with no respiratory symptoms. CT chest with irregular airspace disease throughout much of the right upper lobe concerning for pneumonia. Will place empirically on IV Levaquin. Follow.  #12 Prophylaxis Agreenox for DVT prophylaxis  Code Status: full Family Communication: updated patient and daughters at bedside. Disposition Plan: probable skilled nursing facility.   Consultants:  Orthopedics: Dr. Magnus Ivan 08/05/2013  Procedures:  1 unit packed red blood cells 08/06/2013  Status post IM nail placement right femur 08/06/2013 per Dr. Magnus Ivan  Chest x-ray 08/05/2013  X-ray of the right hip 08/05/2013  CT chest 08/07/2013  Antibiotics:  IV Levaquin 08/08/2013  HPI/Subjective: Patient with no complaints. Patient with chronic dysarthric speech  Objective: Filed Vitals:   08/08/13 1600  BP:   Pulse:   Temp:   Resp: 18    Intake/Output Summary (Last 24 hours) at 08/08/13 1705 Last data filed at 08/08/13 1101  Gross per 24 hour  Intake 1642.5 ml  Output    100 ml  Net 1542.5 ml   Filed Weights   08/05/13 1916 08/06/13 0100  Weight: 63.504 kg (140 lb) 56.3 kg (124 lb 1.9 oz)    Exam:   General:  nad  Cardiovascular: rrr  Respiratory: ctab anterior lung fields.  Abdomen: soft, nontender, nondistended, positive bowel sounds.  Musculoskeletal: no C/C/E   Data Reviewed: Basic Metabolic Panel:  Recent Labs Lab 08/05/13 0001 08/05/13 2138 08/06/13  1610 08/07/13 0753  08/08/13 0519  NA 136 138 138 136 136  K 4.6 4.3 4.3 4.1 4.1  CL 100 104 102 103 103  CO2 23  --  21 20 23   GLUCOSE 108* 120* 95 100* 100*  BUN 48* 46* 43* 30* 31*  CREATININE 1.60* 1.80* 1.45* 1.15* 1.13*  CALCIUM 9.4  --  9.4 8.7 8.4   Liver Function Tests: No results found for this basename: AST, ALT, ALKPHOS, BILITOT, PROT, ALBUMIN,  in the last 168 hours No results found for this basename: LIPASE, AMYLASE,  in the last 168 hours No results found for this basename: AMMONIA,  in the last 168 hours CBC:  Recent Labs Lab 08/05/13 2138 08/05/13 2218 08/06/13 0520 08/07/13 0753 08/08/13 0519  WBC  --  21.3* 19.0* 22.8* 18.3*  NEUTROABS  --  18.6*  --   --   --   HGB 20.4* 8.6* 8.6* 8.3* 7.6*  HCT 60.0* 26.4* 26.2* 25.3* 23.2*  MCV  --  88.9 89.1 89.7 89.9  PLT  --  484* 501* 407* 363   Cardiac Enzymes: No results found for this basename: CKTOTAL, CKMB, CKMBINDEX, TROPONINI,  in the last 168 hours BNP (last 3 results) No results found for this basename: PROBNP,  in the last 8760 hours CBG:  Recent Labs Lab 08/07/13 1607 08/07/13 2147 08/08/13 0626 08/08/13 1131 08/08/13 1617  GLUCAP 139* 110* 102* 99 124*    Recent Results (from the past 240 hour(s))  SURGICAL PCR SCREEN     Status: None   Collection Time    08/06/13  5:46 AM      Result Value Range Status   MRSA, PCR NEGATIVE  NEGATIVE Final   Staphylococcus aureus NEGATIVE  NEGATIVE Final   Comment:            The Xpert SA Assay (FDA     approved for NASAL specimens     in patients over 72 years of age),     is one component of     a comprehensive surveillance     program.  Test performance has     been validated by [employer] for patients greater     than or equal to 27 year old.     It is not intended     to diagnose infection nor to     guide or monitor treatment.     Studies: Ct Chest Wo Contrast  08/07/2013   CLINICAL DATA:  Abnormal chest x-ray. Right upper lobe opacity. Leukocytosis.   EXAM: CT CHEST WITHOUT CONTRAST  TECHNIQUE: Multidetector CT imaging of the chest was performed following the standard protocol without IV contrast.  COMPARISON:  08/05/2013  FINDINGS: There is irregular consolidation with somewhat masslike appearance noted in the right upper lobe. Given the appearance of the patient's history, this likely reflects pneumonia but warrants followup after treatment. Underlying moderate COPD changes. Calcified granuloma in the lingula. No pleural effusion.  Mild cardiomegaly. Calcified subcarinal lymph nodes. Mildly prominent right peritracheal noted with a short axis diameter of 9 mm. No axillary or visible hilar adenopathy.  No acute or focal bony abnormality.  IMPRESSION: Irregular airspace disease throughout much of the right upper lobe. I favor this represents pneumonia, but recommend close short-term followup after treatment to ensure complete resolution and exclude malignancy. Borderline sized right peritracheal lymph node.  Old granulomatous disease.  Moderate COPD.   Electronically Signed   By: Charlett Nose M.D.  On: 08/07/2013 19:04    Scheduled Meds: . citalopram  20 mg Oral Daily  . dipyridamole-aspirin  1 capsule Oral BID  . docusate sodium  100 mg Oral BID  . donepezil  10 mg Oral Daily  . insulin aspart  0-9 Units Subcutaneous TID WC  . levofloxacin (LEVAQUIN) IV  750 mg Intravenous Q24H  . levothyroxine  100 mcg Oral QAC breakfast  . simvastatin  10 mg Oral QHS   Continuous Infusions: . sodium chloride 75 mL/hr (08/07/13 0538)    Principal Problem:   Closed right hip fracture Active Problems:   TIA (transient ischemic attack)   Borderline diabetes mellitus   Dysarthria   Anemia, unspecified   Essential hypertension, benign   Other and unspecified hyperlipidemia   Abnormal CXR   ARF (acute renal failure)   Leukocytosis, unspecified   CAP (community acquired pneumonia)    Time spent: 30 mins    [facility] MD [facility] Pager [phone] . If 7PM-7AM, please contact night-coverage at [email or web address], password [facility] 08/08/2013, 5:05 PM  LOS: 3 days

## 2013-08-08 NOTE — Progress Notes (Signed)
Subjective: 2 Days Post-Op Procedure(s) (LRB): INTRAMEDULLARY (IM) NAIL FEMORAL (Right) Patient reports pain as moderate.  Has been up with therapy.  Recommendation for SNF placement.  Acute on chronic anemia is present.  Vitals stable.  Objective: Vital signs in last 24 hours: Temp:  [97.8 F (36.6 C)-98.6 F (37 C)] 98.6 F (37 C) (12/23 0544) Pulse Rate:  [83-92] 92 (12/23 0544) Resp:  [16-18] 18 (12/23 0544) BP: (110-129)/(54-63) 115/54 mmHg (12/23 0544) SpO2:  [94 %-96 %] 96 % (12/23 0544)  Intake/Output from previous day: 12/22 0701 - 12/23 0700 In: 1642.5 [P.O.:240; I.V.:1402.5] Out: 350 [Urine:350] Intake/Output this shift:     Recent Labs  08/05/13 2138 08/05/13 2218 08/06/13 0520 08/07/13 0753 08/08/13 0519  HGB 20.4* 8.6* 8.6* 8.3* 7.6*    Recent Labs  08/07/13 0753 08/08/13 0519  WBC 22.8* 18.3*  RBC 2.82* 2.58*  HCT 25.3* 23.2*  PLT 407* 363    Recent Labs  08/07/13 0753 08/08/13 0519  NA 136 136  K 4.1 4.1  CL 103 103  CO2 20 23  BUN 30* 31*  CREATININE 1.15* 1.13*  GLUCOSE 100* 100*  CALCIUM 8.7 8.4   No results found for this basename: LABPT, INR,  in the last 72 hours  Intact pulses distally Dorsiflexion/Plantar flexion intact Incision: scant drainage  Assessment/Plan: 2 Days Post-Op Procedure(s) (LRB): INTRAMEDULLARY (IM) NAIL FEMORAL (Right) Up with therapy Discharge to SNF Pathology results not back yet from samples from proximal femur; ? Pathologic fx  Kathryne Hitch 08/08/2013, 7:23 AM

## 2013-08-08 NOTE — Progress Notes (Addendum)
Admissions from [facility] called CSW and states pt has copays (day 1-20 $0; day 21-100 $50). CSW provided phone number for pt's daughter Rayfield Citizen, as Rayfield Citizen is in pt's room and would know answer to some questions admissions was asking CSW (who is pt's PCP, etc). CSW will facilitate discharge to [facility] if pt is medically ready tomorrow. Discharge summary must be in by 11am tomorrow (08/09/13) if pt is ready for discharge. CSW will call Shawna Orleans [phone] if/when ready to go tomorrow.   Maryclare Labrador, MSW, [facility] Clinical Social Worker [phone]

## 2013-08-08 NOTE — Progress Notes (Signed)
Patient ID: [name] [name], female   DOB: [birth year], 72 y.o.   MRN: [id number] I spoke to the Pathologist today and he says the preliminary results show a metastatic carcinoma from the samples I sent from her right femur fracture.  She will need a full oncology work-up likely before discharge to look for the primary source of her cancer.

## 2013-08-08 NOTE — Progress Notes (Signed)
Physical Therapy Treatment Patient Details Name: [name] [name] MRN: [id number] DOB: 1941/06/11 Today's Date: 08/08/2013 Time: 4098-1191 PT Time Calculation (min): 33 min  PT Assessment / Plan / Recommendation  History of Present Illness INTRAMEDULLARY (IM) NAIL FEMORAL (Right) Pathologic fracture   PT Comments   Making progress with mobility and activity tolerance; Noted CIR isn't an option for Ms. Desir; in this case, I agree with SNF  Follow Up Recommendations  SNF     Does the patient have the potential to tolerate intense rehabilitation     Barriers to Discharge        Equipment Recommendations  Rolling walker with 5" wheels;3in1 (PT)    Recommendations for Other Services Rehab consult  Frequency Min 5X/week   Progress towards PT Goals Progress towards PT goals: Progressing toward goals  Plan Discharge plan needs to be updated    Precautions / Restrictions Precautions Precautions: Fall Restrictions RLE Weight Bearing: Partial weight bearing RLE Partial Weight Bearing Percentage or Pounds: 50   Pertinent Vitals/Pain 6/10 R hip; patient repositioned for comfort     Mobility  Bed Mobility Bed Mobility: Supine to Sit;Sitting - Scoot to Edge of Bed Supine to Sit: 1: +2 Total assist;With rails;HOB elevated Supine to Sit: Patient Percentage: 50% Sitting - Scoot to Edge of Bed: 3: Mod assist (light mod) Details for Bed Mobility Assistance: Cues for techniquePain limiting ability to push up to sit, required total assist to elevate tunk from bed; Once up, pt demonstrated good use of UE push to scoot to EOB -- light mod assist (used bed pad to square hips at EOB) Transfers Transfers: Sit to Stand;Stand to Sit Sit to Stand: 3: Mod assist;From bed Stand to Sit: 4: Min assist;To chair/3-in-1 Details for Transfer Assistance: Cues for safety, hand placement and technique Ambulation/Gait Ambulation/Gait Assistance: 1: +2 Total assist Ambulation/Gait: Patient Percentage:  60% Ambulation Distance (Feet): 20 Feet Assistive device: Rolling walker Ambulation/Gait Assistance Details: Cues for gait sequence and to push down into RW for 50% PWB; Noted pt's tremor, which she states has been preent for a while has worsened compared to yesterday's session, leading to coordination deficits with stepping Gait Pattern: Step-to pattern    Exercises Total Joint Exercises Ankle Circles/Pumps: AROM;Both;10 reps Quad Sets: AROM;Both;10 reps Gluteal Sets: AROM;Both;10 reps Heel Slides: AAROM;Right;5 reps Hip ABduction/ADduction: AAROM;Right;5 reps   PT Diagnosis:    PT Problem List:   PT Treatment Interventions:     PT Goals (current goals can now be found in the care plan section) Acute Rehab PT Goals Patient Stated Goal: feel better PT Goal Formulation: With patient Time For Goal Achievement: 08/21/13 Potential to Achieve Goals: Good  Visit Information  Last PT Received On: 08/08/13 Assistance Needed: +2 (helpful for chair push) History of Present Illness: INTRAMEDULLARY (IM) NAIL FEMORAL (Right)    Subjective Data  Subjective: Agreeable to amb Patient Stated Goal: feel better   Cognition  Cognition Arousal/Alertness: Awake/alert Behavior During Therapy: WFL for tasks assessed/performed Overall Cognitive Status: Within Functional Limits for tasks assessed    Balance     End of Session PT - End of Session Equipment Utilized During Treatment: Gait belt Activity Tolerance: Patient limited by pain Patient left: in chair;with call bell/phone within reach;with family/visitor present Nurse Communication: Mobility status   GP     Olen Pel Jordan, [place] [phone]  08/08/2013, 3:54 PM

## 2013-08-09 DIAGNOSIS — J189 Pneumonia, unspecified organism: Secondary | ICD-10-CM

## 2013-08-09 LAB — CBC
HCT: 23.1 % — ABNORMAL LOW (ref 36.0–46.0)
MCV: 89.5 fL (ref 78.0–100.0)
RBC: 2.58 MIL/uL — ABNORMAL LOW (ref 3.87–5.11)
RDW: 13.3 % (ref 11.5–15.5)
WBC: 16.4 10*3/uL — ABNORMAL HIGH (ref 4.0–10.5)

## 2013-08-09 LAB — TYPE AND SCREEN
ABO/RH(D): O POS
Unit division: 0
Unit division: 0

## 2013-08-09 LAB — GLUCOSE, CAPILLARY
Glucose-Capillary: 101 mg/dL — ABNORMAL HIGH (ref 70–99)
Glucose-Capillary: 111 mg/dL — ABNORMAL HIGH (ref 70–99)

## 2013-08-09 LAB — BASIC METABOLIC PANEL
BUN: 28 mg/dL — ABNORMAL HIGH (ref 6–23)
CO2: 23 mEq/L (ref 19–32)
Chloride: 101 mEq/L (ref 96–112)
Creatinine, Ser: 1.1 mg/dL (ref 0.50–1.10)
Sodium: 135 mEq/L (ref 135–145)

## 2013-08-09 LAB — LEGIONELLA ANTIGEN, URINE: Legionella Antigen, Urine: NEGATIVE

## 2013-08-09 MED ORDER — LEVOFLOXACIN IN D5W 750 MG/150ML IV SOLN: 750.0000 mg | INTRAVENOUS | Status: DC

## 2013-08-09 MED ORDER — POLYETHYLENE GLYCOL 3350 17 G PO PACK: 17.0000 g | PACK | Freq: Every day | ORAL | Status: AC

## 2013-08-09 MED ORDER — DSS 100 MG PO CAPS: 100.0000 mg | ORAL_CAPSULE | Freq: Two times a day (BID) | ORAL | Status: AC

## 2013-08-09 MED ORDER — LEVOFLOXACIN 750 MG PO TABS: 750.0000 mg | ORAL_TABLET | ORAL | Status: AC

## 2013-08-09 NOTE — Discharge Summary (Signed)
Physician Discharge Summary  Calle Schader ZOX:[id number] DOB: 1940-10-25 DOA: 08/05/2013  PCP: Selinda Flavin, MD  Admit date: 08/05/2013 Discharge date: 08/09/2013  Time spent: 65 minutes  Recommendations for Outpatient Follow-up:  1. Follow up with Dr Magnus Ivan, orthopedics in 2 weeks. 2. Follow up with Dr Garner Gavel, oncology at [facility] facility. 3. Followup with M.D. at the skilled nursing facility. Patient will need a CBC and a basic metabolic profile checked in one week to followup on hemoglobin, electrolytes and renal function. Patient's hemoglobin is less than 7 recommend transfusion. Patient also need repeat CT of the chest done post antibiotic therapy to followup on resolution of right upper lobe opacity.  Discharge Diagnoses:  Principal Problem:   Closed right hip fracture Active Problems:   TIA (transient ischemic attack)   Borderline diabetes mellitus   Dysarthria   Anemia, unspecified   Essential hypertension, benign   Other and unspecified hyperlipidemia   Abnormal CXR   ARF (acute renal failure)   Leukocytosis, unspecified   CAP (community acquired pneumonia)   Discharge Condition: Stable and improved  Diet recommendation: Regular  Filed Weights   08/05/13 1916 08/06/13 0100  Weight: 63.504 kg (140 lb) 56.3 kg (124 lb 1.9 oz)    History of present illness:  [name] [name] is a 72 y.o. female who was shopping in the afternoon and she heard and felt a "Pop" in her Right Hip and her legs became weak and she fell down. She denies having any syncope associated with the fall. She was brought to the ED and evaluated and was found on imaging to have a closed Right proximal femur fracture. Dr. Magnus Ivan of Orthopedics was consulted by the EDP and plans were made for surgery in the AM.    Hospital Course:  #1 right subtrochanteric femur fracture/probable pathologic fracture likely pathologic fracture. Patient had presented with a right trochanteric fracture which  she had as a pop. Orthopedics was consulted and patient was seen in consultation by Dr. Magnus Ivan. There was a concern for pathologic fracture. Patient is status post IM nail placement to the right femur per Dr. Magnus Ivan 08/06/2013. 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 Final results are pending. Concern will right upper lobe irregular air space disease noted on chest CT is concerning for lung cancer. Will likely need oncology workup as outpatient. Patient's pain was managed patient improved clinically during the hospitalization to be discharged to a skilled nursing facility and will followup with orthopedics as outpatient. Patient will also followup with oncology as outpatient. #2 abnormal chest x-ray  Chest x-ray from admission with right upper lobe opacity.patient with no respiratory symptoms. Patient however with a leukocytosis. CT chest with irregular air space disease throughout much of the right upper lobe, worrisome for pneumonia versus postobstructive pneumonia, however will need repeat CT scan on followup. Patient was started empirically on IV Levaquin and be discharged on 7 more days of oral Levaquin to complete an eight-day course of therapy. #3 abnormal chest x-ray/abnormal CT scan/preliminary results of pathologic fracture  Preliminary results of pathologic fracture concerning for metastatic carcinoma. Chest x-ray showed right upper lobe opacity. CT scan of the chest showed an irregular air space disease in the right upper lobe. Patient with no significant respiratory symptoms. Concern for possible lung cancer versus postobstructive pneumonia. Final results of sample sent from fracture pending. Discussed case with Dr. Rowe Clack, oncology in [place] who will consult on the patient for further evaluation and management,once she gets to the nursing home  at [facility]. Patient is currently in stable condition be discharged on 6 more days of oral Levaquin to complete a  eight-day course of therapy. #4 acute renal failure  Likely secondary to prerenal azotemia. Benicar and HCTZ d/c'd.Renal function improved with hydration. Patient will need a repeat basic metabolic profile done in about a week to followup on her renal function. Follow.  #5 anemia/probable acute blood loss anemia  Patient with no overt GI bleed. Patient is status post 1 unit packed red blood cells perioperatively. H&H is stable. Patient's hemoglobin has remained stable with transfusion threshold for hemoglobin less than 7. Patient currently asymptomatic. Patient was to followup with oncology for further workup as outpatient as well. Patient be discharged in stable and improved condition.  #6 well controlled diabetes mellitus  Hemoglobin A1c 6.2. Patient was maintained on sliding scale insulin during the hospitalization..  #7 hypothyroidism  Continued on home dose Synthroid.  #8 history of TIA/dysarthria/memory loss  Postoperatively patient's  Aggrenox was resumed for secondary stroke prevention. Continued on statin. Continued on Aricept.  #9 COPD  Stable.  #10 hypertension  Benicar and HCTZ on hold secondary to problem #3. Blood pressure currently stable. Patient will be discharged off of her antihypertensive medications as her blood pressure has remained controlled during the hospitalization. Patient's blood pressure will be followed up in the rehabilitation setting and a blood pressure control as needed may consider starting patient on Norvasc.  #11 leukocytosis  Likely reactive. Urinalysis is negative.chest x-ray with no acute infiltrate however does have right upper lobe opacity. Patient with no respiratory symptoms. CT chest with irregular airspace disease throughout much of the right upper lobe concerning for pneumonia. Patient was started empirically on Levaquin with WBC trending down. Patient be discharged on 6 Mondays of oral Levaquin to complete antibiotic course for possible postobstructive  pneumonia versus community-acquired pneumonia. Patient need a CBC done to followup.      Procedures: 1 unit packed red blood cells 08/06/2013  Status post IM nail placement right femur 08/06/2013 per Dr. Magnus Ivan  Chest x-ray 08/05/2013  X-ray of the right hip 08/05/2013  CT chest 08/07/2013   Consultations: Orthopedics: Dr. Magnus Ivan 08/05/2013   Discharge Exam: Filed Vitals:   08/09/13 0800  BP:   Pulse:   Temp:   Resp: 18    General: NAD Cardiovascular: RRR Respiratory: CTAB anterior lung fields. No wheezing.  Discharge Instructions      Discharge Orders   Future Appointments Provider Department Dept Phone   08/30/2013 1:30 PM Nilda Riggs, NP [facility] [phone]   Future Orders Complete By Expires   Diet general  As directed    Discharge instructions  As directed    Comments:     Follow up with Dr Magnus Ivan with orthopedics in 2 weeks. Follow up with Dr Garner Gavel, oncolgy at rehab.   Partial weight bearing  As directed    Scheduling Instructions:     50% weight only on right leg   Questions:     % Body Weight:     Laterality:  right   Extremity:  Lower       Medication List    STOP taking these medications       BENICAR HCT 40-12.5 MG per tablet  Generic drug:  olmesartan-hydrochlorothiazide      TAKE these medications       CALTRATE 600 PO  Take by mouth. Take 3 times a week     CHEWABLE MULTIVITE/FL PO  Take 1  tablet by mouth daily.     citalopram 20 MG tablet  Commonly known as:  CELEXA  Take 20 mg by mouth daily.     dipyridamole-aspirin 200-25 MG per 12 hr capsule  Commonly known as:  AGGRENOX  Take 1 capsule by mouth 2 (two) times daily.     donepezil 10 MG tablet  Commonly known as:  ARICEPT  Take 10 mg by mouth daily.     DSS 100 MG Caps  Take 100 mg by mouth 2 (two) times daily.     HYDROcodone-acetaminophen 5-325 MG per tablet  Commonly known as:  NORCO  Take 1-2 tablets by mouth every 4  (four) hours as needed for moderate pain.     levofloxacin 750 MG tablet  Commonly known as:  LEVAQUIN  Take 1 tablet (750 mg total) by mouth every other day. Take for 7 days.  Start taking on:  08/10/2013     LEVOTHROID 100 MCG tablet  Generic drug:  levothyroxine  Take 100 mcg by mouth daily before breakfast.     MAGNESIUM CITRATE PO  Take 400 mg by mouth daily.     polyethylene glycol packet  Commonly known as:  MIRALAX / GLYCOLAX  Take 17 g by mouth daily.     simvastatin 10 MG tablet  Commonly known as:  ZOCOR  Take 10 mg by mouth at bedtime.     SUPER B COMPLEX PO  Take 1 tablet by mouth daily.     TOVIAZ 4 MG Tb24 tablet  Generic drug:  fesoterodine  Take 4 mg by mouth daily.     VENTOLIN HFA 108 (90 BASE) MCG/ACT inhaler  Generic drug:  albuterol  Inhale 1 puff into the lungs every 4 (four) hours as needed.     Vitamin D3 1000 UNITS Caps  Take 100 Units by mouth daily.       Allergies  Allergen Reactions  . Crestor [Rosuvastatin]   . Lipitor [Atorvastatin]   . Penicillins   . Red Yeast Rice [Cholestin]    Follow-up Information   Follow up with Kathryne Hitch, MD. Schedule an appointment as soon as possible for a visit in 2 weeks.   Specialty:  Orthopedic Surgery   Contact information:   [address] [place] [address] [phone]       Follow up with DAROVSKY,BORIS, MD. (f/u at [facility])    Specialty:  Internal Medicine   Contact information:   [address] [place] [address] [phone]        The results of significant diagnostics from this hospitalization (including imaging, microbiology, ancillary and laboratory) are listed below for reference.    Significant Diagnostic Studies: Dg Chest 1 View  08/05/2013   CLINICAL DATA:  Fall, hip pain  EXAM: CHEST - 1 VIEW  COMPARISON:  11/14/2011  FINDINGS: Right upper lobe opacity. While possibly infectious/inflammatory, in the absence of infectious symptoms, underlying mass  should be considered.  No pleural effusion or pneumothorax.  Cardiomegaly.  Cervical spine fixation hardware.  IMPRESSION: Right upper lobe opacity. In the absence of infectious symptoms, consider CT chest with contrast to exclude underlying mass.   Electronically Signed   By: Charline Bills M.D.   On: 08/05/2013 21:18   Dg Hip Complete Right  08/05/2013   CLINICAL DATA:  Trauma/fall, right hip deformity  EXAM: RIGHT HIP - COMPLETE 2+ VIEW  COMPARISON:  None.  FINDINGS: Subtrochanteric right proximal femur fracture. Foreshortening with varus angulation.  Associated lucency/irregularity  along the fracture site raises the possibility of an underlying pathologic lesion. Mild patchy sclerosis with associated cortical thickening is present in this region on recent radiographs, although no definite underlying lesion is visualized on the study.  Degenerative changes of the bilateral hips.  Visualized bony pelvis appears intact.  IMPRESSION: Subtrochanteric right proximal femur fracture.  Although not definitely visualized on the prior study, an underlying pathologic lesion is not excluded.   Electronically Signed   By: Charline Bills M.D.   On: 08/05/2013 21:14   Dg Hip Operative Right  08/06/2013   CLINICAL DATA:  Right subtrochanteric fracture.  Internal fixation.  EXAM: DG OPERATIVE RIGHT HIP  TECHNIQUE: A single spot fluoroscopic AP image of the right hip is submitted.  COMPARISON:  08/05/2013  FINDINGS: Multiple intraoperative spot images demonstrate internal fixation across the proximal right femoral shaft fracture. Near anatomic alignment. No hardware or bony complicating feature.  IMPRESSION: Internal fixation as above.   Electronically Signed   By: Charlett Nose M.D.   On: 08/06/2013 13:20   Ct Chest Wo Contrast  08/07/2013   CLINICAL DATA:  Abnormal chest x-ray. Right upper lobe opacity. Leukocytosis.  EXAM: CT CHEST WITHOUT CONTRAST  TECHNIQUE: Multidetector CT imaging of the chest was performed  following the standard protocol without IV contrast.  COMPARISON:  08/05/2013  FINDINGS: There is irregular consolidation with somewhat masslike appearance noted in the right upper lobe. Given the appearance of the patient's history, this likely reflects pneumonia but warrants followup after treatment. Underlying moderate COPD changes. Calcified granuloma in the lingula. No pleural effusion.  Mild cardiomegaly. Calcified subcarinal lymph nodes. Mildly prominent right peritracheal noted with a short axis diameter of 9 mm. No axillary or visible hilar adenopathy.  No acute or focal bony abnormality.  IMPRESSION: Irregular airspace disease throughout much of the right upper lobe. I favor this represents pneumonia, but recommend close short-term followup after treatment to ensure complete resolution and exclude malignancy. Borderline sized right peritracheal lymph node.  Old granulomatous disease.  Moderate COPD.   Electronically Signed   By: Charlett Nose M.D.   On: 08/07/2013 19:04    Microbiology: Recent Results (from the past 240 hour(s))  SURGICAL PCR SCREEN     Status: None   Collection Time    08/06/13  5:46 AM      Result Value Range Status   MRSA, PCR NEGATIVE  NEGATIVE Final   Staphylococcus aureus NEGATIVE  NEGATIVE Final   Comment:            The Xpert SA Assay (FDA     approved for NASAL specimens     in patients over 74 years of age),     is one component of     a comprehensive surveillance     program.  Test performance has     been validated by [employer] for patients greater     than or equal to 57 year old.     It is not intended     to diagnose infection nor to     guide or monitor treatment.     Labs: Basic Metabolic Panel:  Recent Labs Lab 08/05/13 0001 08/05/13 2138 08/06/13 0520 08/07/13 0753 08/08/13 0519 08/09/13 0422  NA 136 138 138 136 136 135  K 4.6 4.3 4.3 4.1 4.1 4.0  CL 100 104 102 103 103 101  CO2 23  --  21 20 23 23   GLUCOSE 108* 120* 95 100*  100* 96  BUN 48* 46* 43* 30* 31* 28*  CREATININE 1.60* 1.80* 1.45* 1.15* 1.13* 1.10  CALCIUM 9.4  --  9.4 8.7 8.4 8.5   Liver Function Tests: No results found for this basename: AST, ALT, ALKPHOS, BILITOT, PROT, ALBUMIN,  in the last 168 hours No results found for this basename: LIPASE, AMYLASE,  in the last 168 hours No results found for this basename: AMMONIA,  in the last 168 hours CBC:  Recent Labs Lab 08/05/13 2138 08/05/13 2218 08/06/13 0520 08/07/13 0753 08/08/13 0519 08/09/13 0422  WBC  --  21.3* 19.0* 22.8* 18.3* 16.4*  NEUTROABS  --  18.6*  --   --   --   --   HGB 20.4* 8.6* 8.6* 8.3* 7.6* 7.5*  HCT 60.0* 26.4* 26.2* 25.3* 23.2* 23.1*  MCV  --  88.9 89.1 89.7 89.9 89.5  PLT  --  484* 501* 407* 363 375   Cardiac Enzymes: No results found for this basename: CKTOTAL, CKMB, CKMBINDEX, TROPONINI,  in the last 168 hours BNP: BNP (last 3 results) No results found for this basename: PROBNP,  in the last 8760 hours CBG:  Recent Labs Lab 08/08/13 0626 08/08/13 1131 08/08/13 1617 08/08/13 2139 08/09/13 0640  GLUCAP 102* 99 124* 122* 101*       Signed:  Shep Porter MD [facility] 08/09/2013, 10:35 AM

## 2013-08-09 NOTE — Progress Notes (Signed)
Physical Therapy Treatment Patient Details Name: [name] [name] MRN: [id number] DOB: [birth year] Today's Date: 08/09/2013 Time: 1191-4782 PT Time Calculation (min): 29 min  PT Assessment / Plan / Recommendation  History of Present Illness INTRAMEDULLARY (IM) NAIL FEMORAL (Right) for pathologic fracture   PT Comments   Pt. Making steady progress with ambulation and is able to comply with 50% PWB 100% of time with shorter distances.  She is pleased with her progress, as well daughter  Follow Up Recommendations  SNF     Does the patient have the potential to tolerate intense rehabilitation     Barriers to Discharge        Equipment Recommendations  Rolling walker with 5" wheels;3in1 (PT)    Recommendations for Other Services    Frequency Min 5X/week   Progress towards PT Goals Progress towards PT goals: Progressing toward goals  Plan Current plan remains appropriate    Precautions / Restrictions Precautions Precautions: Fall Restrictions Weight Bearing Restrictions: Yes RLE Weight Bearing: Partial weight bearing RLE Partial Weight Bearing Percentage or Pounds: 50   Pertinent Vitals/Pain See vitals tab No distress and mobility not significantly limited by pain     Mobility  Bed Mobility Bed Mobility: Not assessed (pt. up in recliner) Transfers Transfers: Sit to Stand;Stand to Sit Sit to Stand: 3: Mod assist;From chair/3-in-1;With upper extremity assist;With armrests Stand to Sit: 4: Min assist;With armrests;With upper extremity assist;To chair/3-in-1 Details for Transfer Assistance: Cues for safety, hand placement and technique Ambulation/Gait Ambulation/Gait Assistance: 1: +2 Total assist Ambulation/Gait: Patient Percentage: 70% Ambulation Distance (Feet): 30 Feet (20' then 30') Assistive device: Rolling walker Ambulation/Gait Assistance Details: Pt. doing well with 50% PWB.  She was able to ambulate 20 then 30 feet today with seated rest.  Better activity  tolerance; Gait Pattern: Step-to pattern Gait velocity: decreased    Exercises General Exercises - Lower Extremity Ankle Circles/Pumps: AROM;Both;10 reps Quad Sets: AROM;Both;10 reps   PT Diagnosis:    PT Problem List:   PT Treatment Interventions:     PT Goals (current goals can now be found in the care plan section)    Visit Information  Last PT Received On: 08/09/13 Assistance Needed: +2 (helpful to bring recliner) History of Present Illness: INTRAMEDULLARY (IM) NAIL FEMORAL (Right) for pathologic fracture    Subjective Data  Subjective: Pt. pleased with her progress today   Cognition  Cognition Arousal/Alertness: Awake/alert Behavior During Therapy: WFL for tasks assessed/performed Overall Cognitive Status: Within Functional Limits for tasks assessed    Balance     End of Session PT - End of Session Equipment Utilized During Treatment: Gait belt Activity Tolerance: Patient limited by pain;Patient limited by fatigue Patient left: Other (comment) (EMS transport had arrived; pt. walked to stretcher, supine) Nurse Communication: Mobility status   GP     Ferman Hamming 08/09/2013, 12:10 PM Weldon Picking PT Acute Rehab Services [phone] [place] [phone]

## 2013-08-09 NOTE — Progress Notes (Signed)
CSW (Clinical Social Worker) prepared pt dc packet and placed with shadow chart. CSW arranged non-emergent ambulance transport. Pt, pt family, pt nurse, and facility informed. CSW signing off.  Danell Verno, LCSWA [phone]  

## 2013-08-28 ENCOUNTER — Other Ambulatory Visit (HOSPITAL_COMMUNITY): Payer: Self-pay | Admitting: Oncology

## 2013-08-30 ENCOUNTER — Ambulatory Visit: Payer: Medicare Other | Admitting: Nurse Practitioner

## 2013-09-15 ENCOUNTER — Ambulatory Visit (HOSPITAL_COMMUNITY): Admission: RE | Admit: 2013-09-15 | Payer: Medicare Other | Source: Ambulatory Visit

## 2013-09-26 ENCOUNTER — Encounter (HOSPITAL_COMMUNITY): Payer: Medicare Other

## 2013-12-15 DEATH — deceased

## 2014-04-16 ENCOUNTER — Encounter (INDEPENDENT_AMBULATORY_CARE_PROVIDER_SITE_OTHER): Payer: Self-pay | Admitting: *Deleted

## 2014-04-17 ENCOUNTER — Encounter (INDEPENDENT_AMBULATORY_CARE_PROVIDER_SITE_OTHER): Payer: Self-pay

## 2014-05-23 IMAGING — CR DG HIP COMPLETE 2+V*R*
3 series · 3 of 3 positions shown · non-contrast
Comparison: None.

CLINICAL DATA: Trauma/fall, right hip deformity

EXAM:
RIGHT HIP - COMPLETE 2+ VIEW

[t pelvis ap]
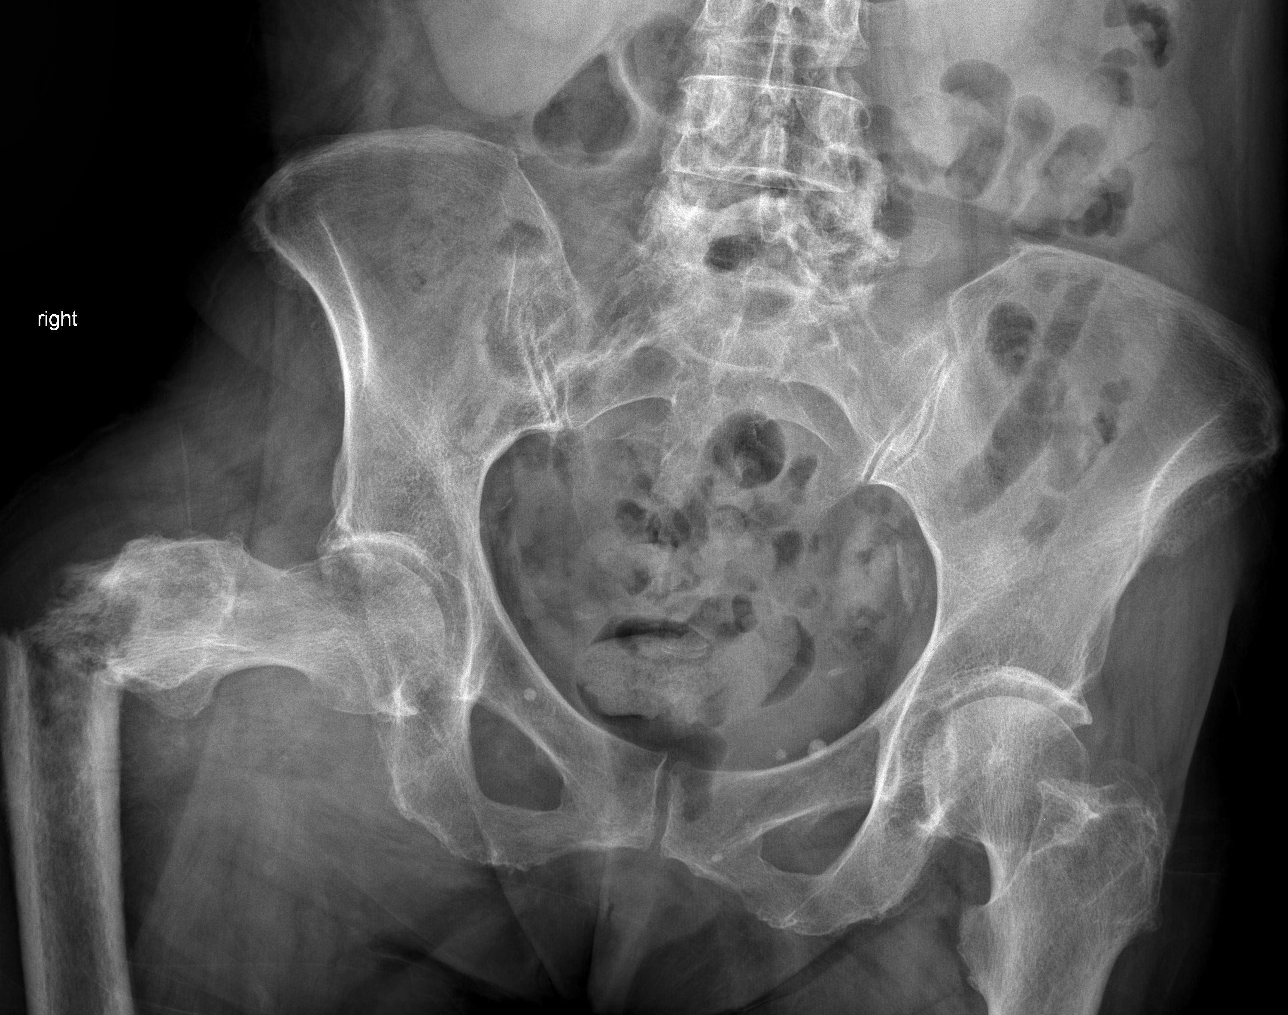

[t hip ap right]
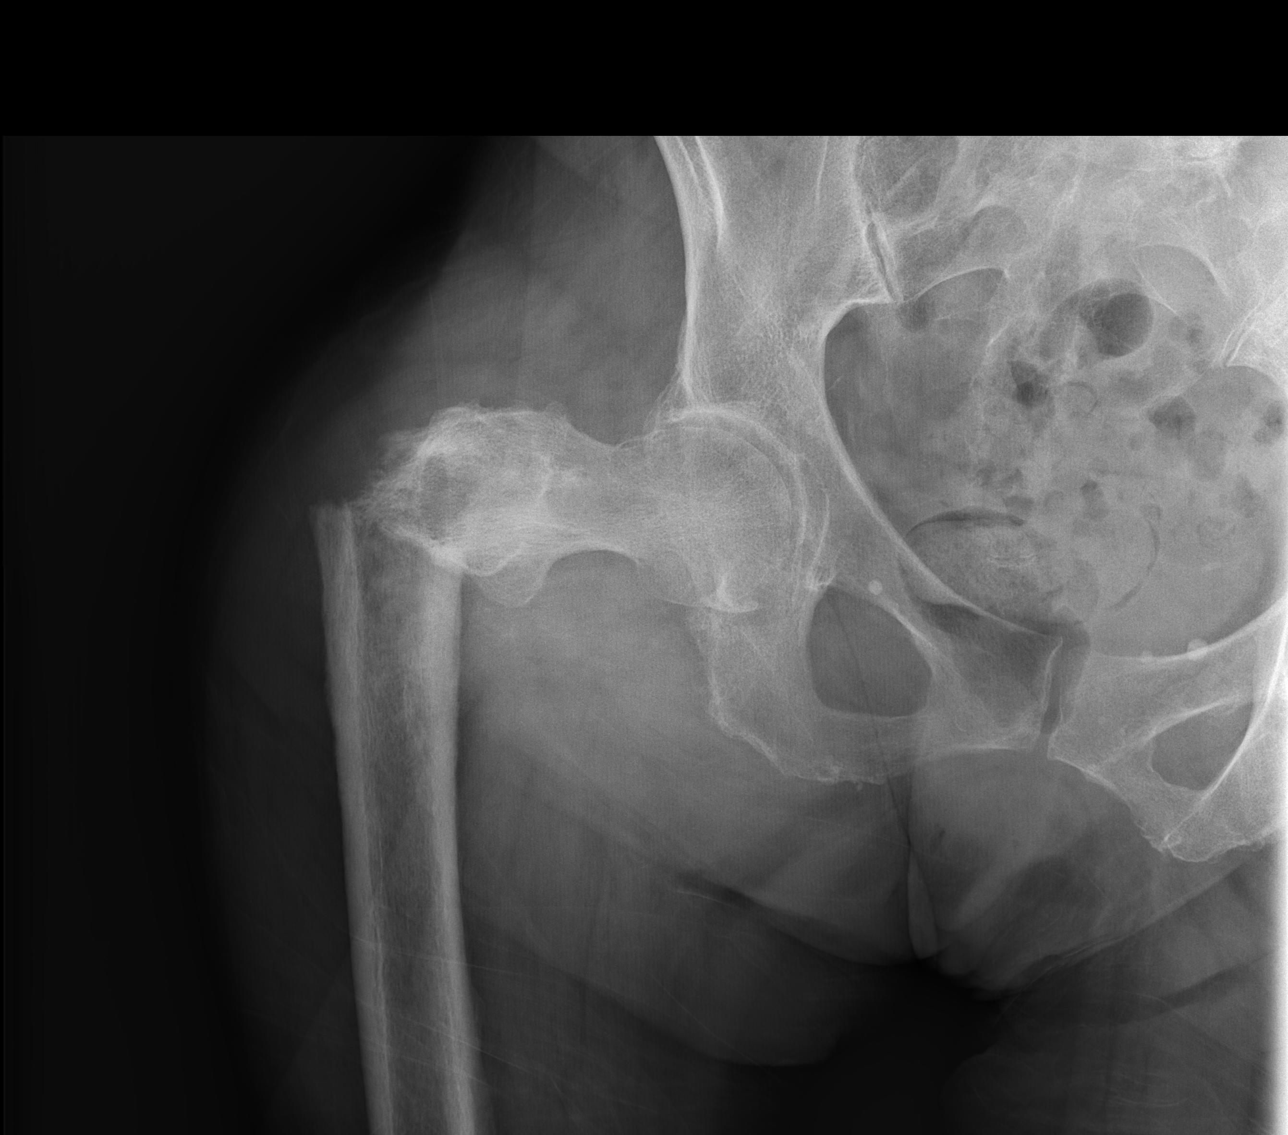

[w hip lat right]
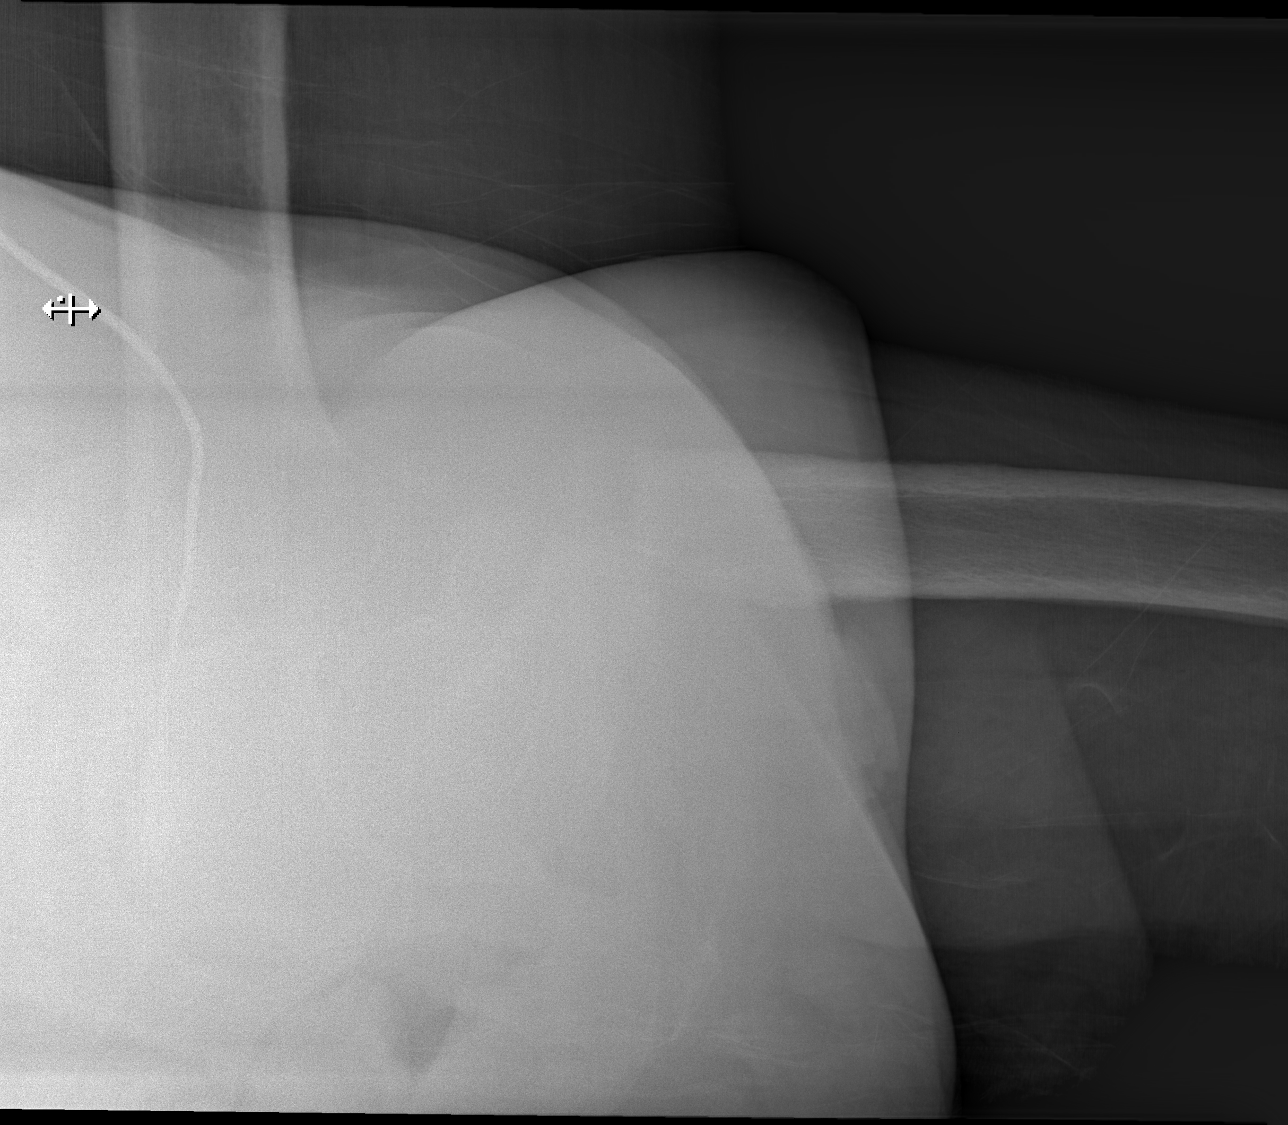

[3 of 3 positions shown; findings below may reference images not displayed]

FINDINGS: Subtrochanteric right proximal femur fracture. Foreshortening with
varus angulation.

Associated lucency/irregularity along the fracture site raises the
possibility of an underlying pathologic lesion. Mild patchy
sclerosis with associated cortical thickening is present in this
region on recent radiographs, although no definite underlying lesion
is visualized on the study.

Degenerative changes of the bilateral hips.

Visualized bony pelvis appears intact.
IMPRESSION: Subtrochanteric right proximal femur fracture.

Although not definitely visualized on the prior study, an underlying
pathologic lesion is not excluded.

## 2014-05-24 IMAGING — RF DG HIP OPERATIVE*R*
1 series · 6 of 6 positions shown · non-contrast
Comparison: 08/05/2013

CLINICAL DATA: Right subtrochanteric fracture.  Internal fixation.

EXAM:
DG OPERATIVE RIGHT HIP
TECHNIQUE: A single spot fluoroscopic AP image of the right hip is submitted.

[Series 1: run · 6 of 6 slices shown]
[im 1/6]
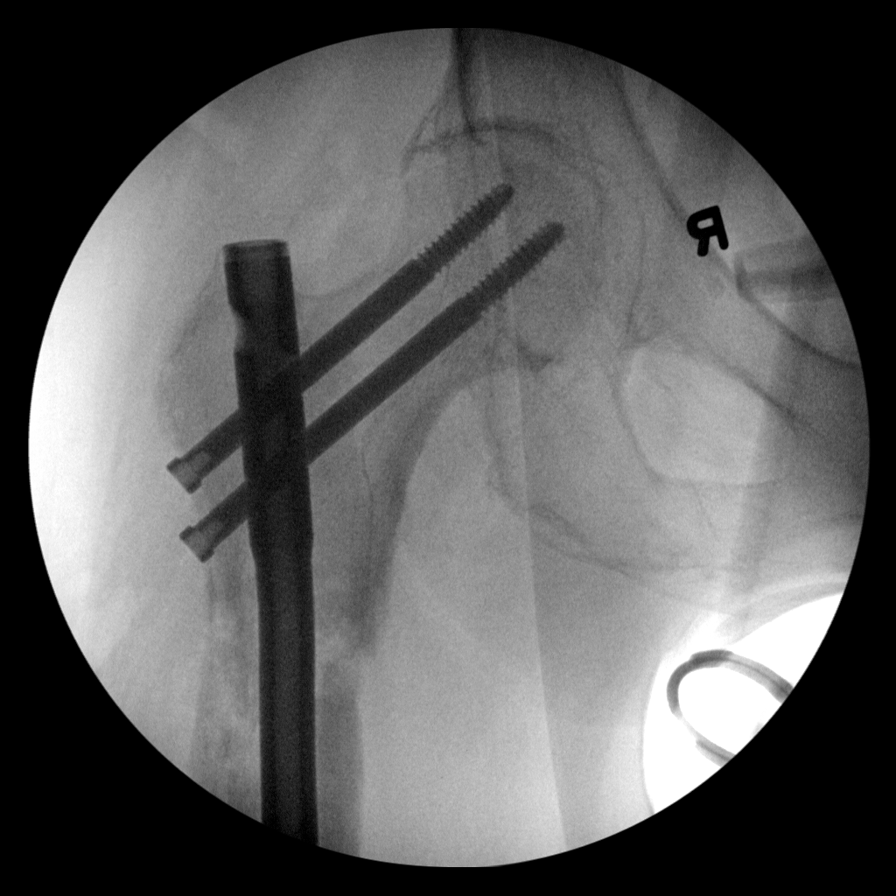
[im 2/6]
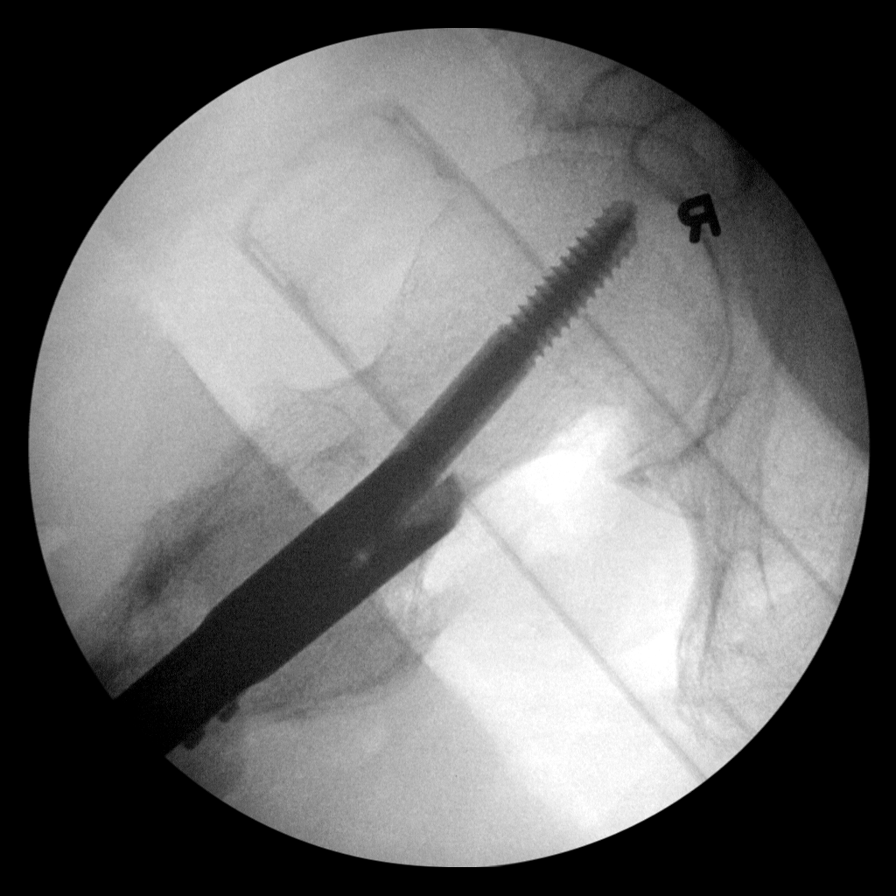
[im 3/6]
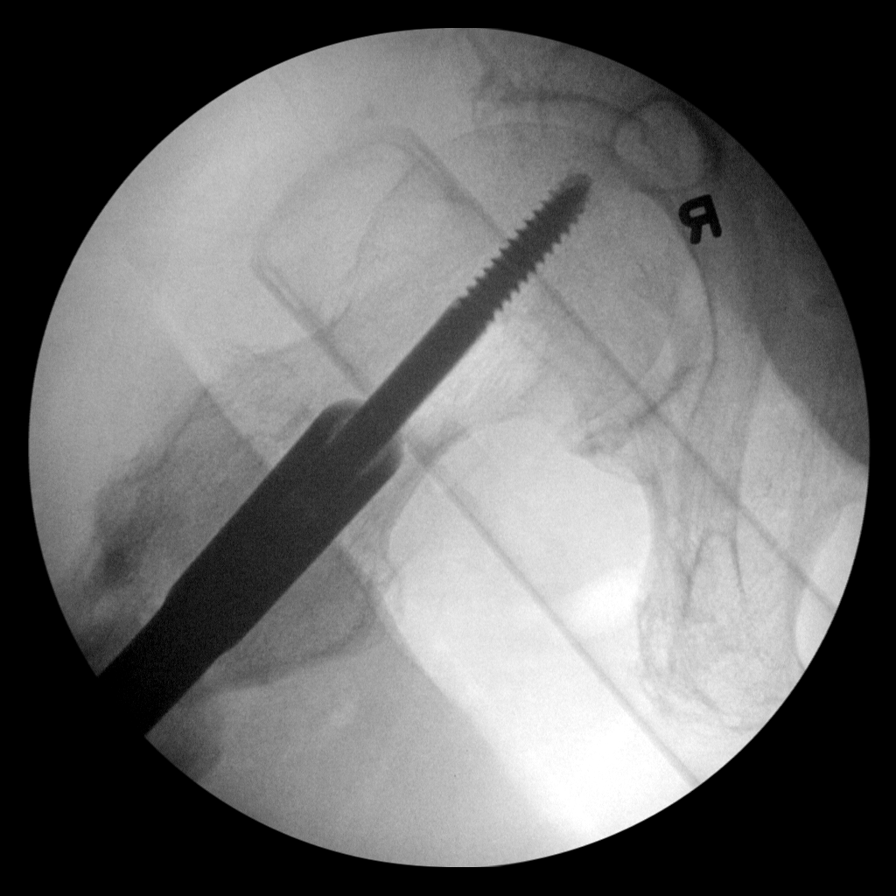
[im 4/6]
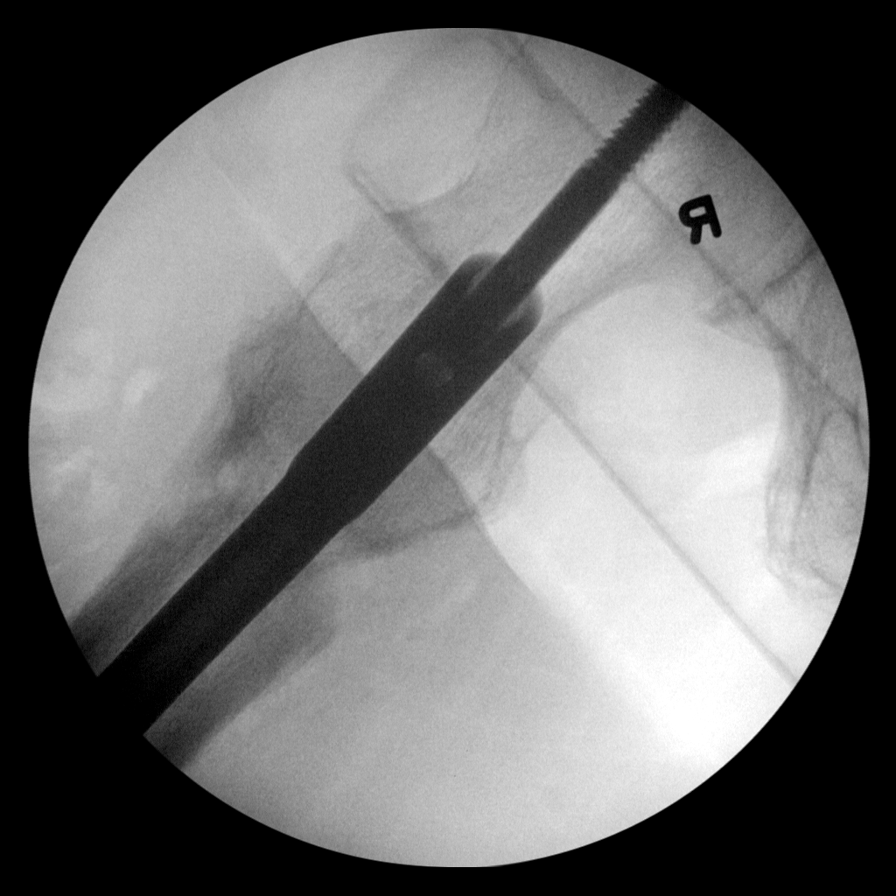
[im 5/6]
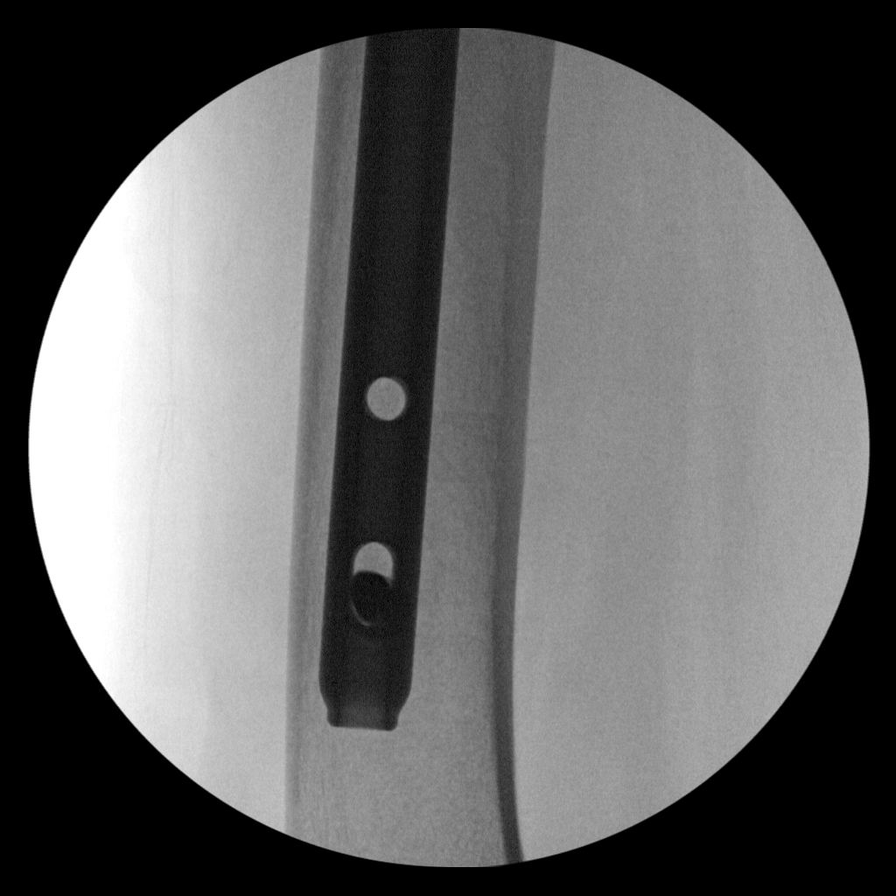
[im 6/6]
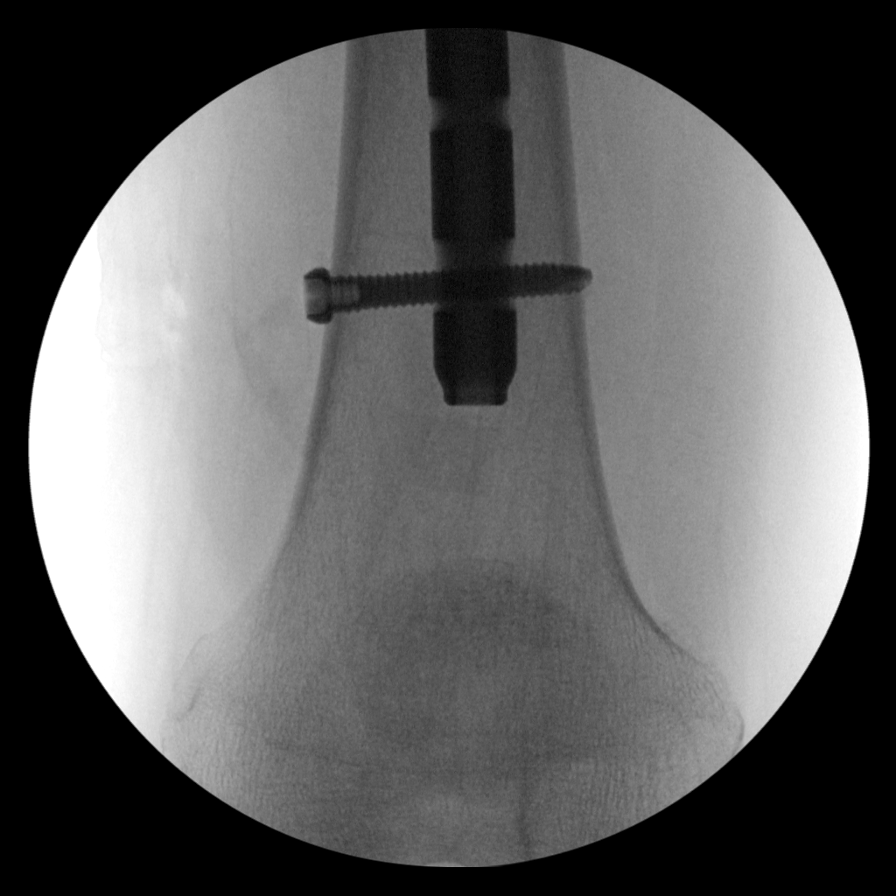

[6 of 6 positions shown; findings below may reference images not displayed]

FINDINGS: Multiple intraoperative spot images demonstrate internal fixation
across the proximal right femoral shaft fracture. Near anatomic
alignment. No hardware or bony complicating feature.
IMPRESSION: Internal fixation as above.
